# Patient Record
Sex: Female | Born: 1995 | Hispanic: Yes | Marital: Single | Smoking: Never smoker
Health system: Southern US, Community
[De-identification: ages and names within clinical notes are randomized; demographics above are authoritative.]

## PROBLEM LIST (undated history)

## (undated) DIAGNOSIS — F329 Major depressive disorder, single episode, unspecified: Secondary | ICD-10-CM

## (undated) DIAGNOSIS — F419 Anxiety disorder, unspecified: Secondary | ICD-10-CM

## (undated) DIAGNOSIS — F32A Depression, unspecified: Secondary | ICD-10-CM

## (undated) DIAGNOSIS — R519 Headache, unspecified: Secondary | ICD-10-CM

## (undated) DIAGNOSIS — Z789 Other specified health status: Secondary | ICD-10-CM

## (undated) HISTORY — DX: Anxiety disorder, unspecified: F41.9

## (undated) HISTORY — PX: NO PAST SURGERIES: SHX2092

## (undated) HISTORY — DX: Headache, unspecified: R51.9

## (undated) HISTORY — DX: Depression, unspecified: F32.A

---
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
## 2018-06-14 ENCOUNTER — Emergency Department (HOSPITAL_COMMUNITY): Payer: BLUE CROSS/BLUE SHIELD

## 2018-06-14 ENCOUNTER — Encounter (HOSPITAL_COMMUNITY): Payer: Self-pay | Admitting: *Deleted

## 2018-06-14 ENCOUNTER — Other Ambulatory Visit: Payer: Self-pay

## 2018-06-14 ENCOUNTER — Emergency Department (HOSPITAL_COMMUNITY)
Admission: EM | Admit: 2018-06-14 | Discharge: 2018-06-14 | Disposition: A | Discharge status: Home / Self Care | Payer: BLUE CROSS/BLUE SHIELD | Attending: Emergency Medicine | Admitting: Emergency Medicine

## 2018-06-14 DIAGNOSIS — R1110 Vomiting, unspecified: Secondary | ICD-10-CM | POA: Insufficient documentation

## 2018-06-14 DIAGNOSIS — J189 Pneumonia, unspecified organism: Secondary | ICD-10-CM | POA: Insufficient documentation

## 2018-06-14 LAB — COMPREHENSIVE METABOLIC PANEL
ALT: 18 U/L (ref 0–44)
ANION GAP: 12 (ref 5–15)
AST: 26 U/L (ref 15–41)
Albumin: 4 g/dL (ref 3.5–5.0)
Alkaline Phosphatase: 56 U/L (ref 38–126)
BUN: 9 mg/dL (ref 6–20)
CO2: 24 mmol/L (ref 22–32)
Calcium: 9.4 mg/dL (ref 8.9–10.3)
Chloride: 100 mmol/L (ref 98–111)
Creatinine, Ser: 0.92 mg/dL (ref 0.44–1.00)
Glucose, Bld: 93 mg/dL (ref 70–99)
Potassium: 4.3 mmol/L (ref 3.5–5.1)
Sodium: 136 mmol/L (ref 135–145)
Total Bilirubin: 0.5 mg/dL (ref 0.3–1.2)
Total Protein: 8.3 g/dL — ABNORMAL HIGH (ref 6.5–8.1)

## 2018-06-14 LAB — I-STAT CG4 LACTIC ACID, ED: Lactic Acid, Venous: 0.85 mmol/L (ref 0.5–1.9)

## 2018-06-14 LAB — CBC WITH DIFFERENTIAL/PLATELET
Abs Immature Granulocytes: 0.05 10*3/uL (ref 0.00–0.07)
Basophils Absolute: 0 10*3/uL (ref 0.0–0.1)
Basophils Relative: 0 %
EOS ABS: 0.1 10*3/uL (ref 0.0–0.5)
Eosinophils Relative: 1 %
HCT: 40.6 % (ref 36.0–46.0)
Hemoglobin: 13.3 g/dL (ref 12.0–15.0)
IMMATURE GRANULOCYTES: 0 %
Lymphocytes Relative: 8 %
Lymphs Abs: 1.1 10*3/uL (ref 0.7–4.0)
MCH: 29.5 pg (ref 26.0–34.0)
MCHC: 32.8 g/dL (ref 30.0–36.0)
MCV: 90 fL (ref 80.0–100.0)
Monocytes Absolute: 0.9 10*3/uL (ref 0.1–1.0)
Monocytes Relative: 7 %
Neutro Abs: 10.8 10*3/uL — ABNORMAL HIGH (ref 1.7–7.7)
Neutrophils Relative %: 84 %
Platelets: 405 10*3/uL — ABNORMAL HIGH (ref 150–400)
RBC: 4.51 MIL/uL (ref 3.87–5.11)
RDW: 11.4 % — ABNORMAL LOW (ref 11.5–15.5)
WBC: 12.9 10*3/uL — AB (ref 4.0–10.5)
nRBC: 0 % (ref 0.0–0.2)

## 2018-06-14 LAB — I-STAT BETA HCG BLOOD, ED (MC, WL, AP ONLY)

## 2018-06-14 MED ORDER — AMOXICILLIN-POT CLAVULANATE ER 1000-62.5 MG PO TB12: 2.0000 | ORAL_TABLET | Freq: Two times a day (BID) | ORAL | 0 refills | Status: DC

## 2018-06-14 MED ORDER — DOXYCYCLINE HYCLATE 100 MG PO CAPS: 100.0000 mg | ORAL_CAPSULE | Freq: Two times a day (BID) | ORAL | 0 refills | Status: DC

## 2018-06-14 MED ORDER — ONDANSETRON HCL 4 MG PO TABS: 4.0000 mg | ORAL_TABLET | Freq: Four times a day (QID) | ORAL | 0 refills | Status: DC

## 2018-06-14 MED ORDER — ONDANSETRON 4 MG PO TBDP
4.0000 mg | ORAL_TABLET | Freq: Once | ORAL | Status: AC
  Administered 2018-06-14: 4 mg via ORAL
  Filled 2018-06-14: qty 1

## 2018-06-14 NOTE — ED Provider Notes (Signed)
[facility] EMERGENCY DEPARTMENT Provider Note   CSN: [id number] Arrival date & time: 06/14/18  1223     History   Chief Complaint Chief Complaint  Patient presents with  . Pneumonia    HPI Diana Woodard is a 22 y.o. female.  HPI  22 year old female with no significant past medical history presents today with complaints of pneumonia.  She notes originally on February 27 she developed upper respiratory infection including cough.  She notes she was seen [facility] 2 December.  At that time she was diagnosed with acute bronchitis and was started on cough medication, steroids, and albuterol inhaler.  She notes she continued to have symptoms with coughing fatigue and minor shortness of breath.  She was again seen 4 days ago and was diagnosed with pneumonia.  She was put on cefdinir at that time.  She notes she has been taking the antibiotics for the last 4 days with no improvement in her symptoms.  She denies any significant worsening in symptoms, she denies any fever.  She notes no close sick contacts.  She does note a history asthma as a kid but denies any complications or need for inhalers.  Patient notes that she has had vomiting after coughing, none at rest.  She denies any previous hospitalizations or risk factors for hospital-acquired pneumonia.     History reviewed. No pertinent past medical history.  There are no active problems to display for this patient.   History reviewed. No pertinent surgical history.   OB History   None      Home Medications    Prior to Admission medications   Medication Sig Start Date End Date Taking? Authorizing Provider  amoxicillin-clavulanate (AUGMENTIN XR) 1000-62.5 MG 12 hr tablet Take 2 tablets by mouth 2 (two) times daily. 06/14/18   Alyviah Crandle, Tinnie Gens, PA-C  doxycycline (VIBRAMYCIN) 100 MG capsule Take 1 capsule (100 mg total) by mouth 2 (two) times daily. 06/14/18   Soma Bachand, Tinnie Gens, PA-C  ondansetron  (ZOFRAN) 4 MG tablet Take 1 tablet (4 mg total) by mouth every 6 (six) hours. 06/14/18   Eyvonne Mechanic, PA-C    Family History History reviewed. No pertinent family history.  Social History Social History   Tobacco Use  . Smoking status: Not on file  Substance Use Topics  . Alcohol use: Not on file  . Drug use: Not on file     Allergies   Patient has no known allergies.   Review of Systems Review of Systems  All other systems reviewed and are negative.   Physical Exam Updated Vital Signs BP 128/81 (BP Location: Right Arm)   Pulse (!) 110   Temp 98.4 F (36.9 C) (Oral)   Resp 16   SpO2 94%   Physical Exam  Constitutional: She is oriented to person, place, and time. She appears well-developed and well-nourished.  HENT:  Head: Normocephalic and atraumatic.  Eyes: Pupils are equal, round, and reactive to light. Conjunctivae are normal. Right eye exhibits no discharge. Left eye exhibits no discharge. No scleral icterus.  Neck: Normal range of motion. No JVD present. No tracheal deviation present.  Cardiovascular: Normal rate, regular rhythm and normal heart sounds.  Pulmonary/Chest: Effort normal and breath sounds normal. No stridor. No respiratory distress. She has no wheezes. She has no rales. She exhibits no tenderness.  Neurological: She is alert and oriented to person, place, and time. Coordination normal.  Psychiatric: She has a normal mood and affect. Her behavior  is normal. Judgment and thought content normal.  Nursing note and vitals reviewed.    ED Treatments / Results  Labs (all labs ordered are listed, but only abnormal results are displayed) Labs Reviewed  COMPREHENSIVE METABOLIC PANEL - Abnormal; Notable for the following components:      Result Value   Total Protein 8.3 (*)    All other components within normal limits  CBC WITH DIFFERENTIAL/PLATELET - Abnormal; Notable for the following components:   WBC 12.9 (*)    RDW 11.4 (*)    Platelets 405  (*)    Neutro Abs 10.8 (*)    All other components within normal limits  URINALYSIS, ROUTINE W REFLEX MICROSCOPIC  I-STAT CG4 LACTIC ACID, ED  I-STAT BETA HCG BLOOD, ED ([facility], [facility], [facility] ONLY)  I-STAT CG4 LACTIC ACID, ED    EKG None  Radiology Dg Chest 2 View  Result Date: 06/14/2018 CLINICAL DATA:  Productive cough EXAM: CHEST - 2 VIEW COMPARISON:  None. FINDINGS: There is focal infiltrate in the posterior segment of the left lower lobe. There is more ill-defined patchy infiltrate throughout portions of the right lower lobe. Lungs elsewhere are clear. Heart size and pulmonary vascularity are normal. No adenopathy. No bone lesions. IMPRESSION: Focal infiltrate consistent with pneumonia in the posterior segment of the left lower lobe. More patchy areas of infiltrate throughout the right lower lobe. Lungs elsewhere are clear. No adenopathy. Electronically Signed   By: Bretta Bang III M.D.   On: 06/14/2018 13:27    Procedures Procedures (including critical care time)  Medications Ordered in ED Medications  ondansetron (ZOFRAN-ODT) disintegrating tablet 4 mg (4 mg Oral Given 06/14/18 1405)     Initial Impression / Assessment and Plan / ED Course  I have reviewed the triage vital signs and the nursing notes.  Pertinent labs & imaging results that were available during my care of the patient were reviewed by me and considered in my medical decision making (see chart for details).     Labs:   Imaging:  Consults:  Therapeutics:  Discharge Meds: Augmentin, doxycycline, Zofran  Assessment/Plan: 22 year old female presents today with community-acquired pneumonia.  Patient is well-appearing in no acute distress and is afebrile.  She has clear lung sounds I personally ambulated her with no hypoxic episodes ranging in the mid 90s.  Patient has been on cefdinir for the last 4 days, she has x-ray showing pneumonia here.  She is otherwise healthy young female with reassuring physical exam.   I do feel she would be stable for outpatient management with dual therapy as indicated.  She will follow-up immediately if she develops any new or worsening signs or symptoms, she will follow-up for repeat evaluation in 5 days if her symptoms are not significantly improving and still having symptoms.  Patient notes that she is having episodes of vomiting after coughing, none at rest, likely posttussive emesis.  Patient verbalized understanding and agreement to today's plan had no further questions or concerns at time of discharge.     Final Clinical Impressions(s) / ED Diagnoses   Final diagnoses:  Community acquired pneumonia, unspecified laterality    ED Discharge Orders         Ordered    amoxicillin-clavulanate (AUGMENTIN XR) 1000-62.5 MG 12 hr tablet  2 times daily     06/14/18 1414    doxycycline (VIBRAMYCIN) 100 MG capsule  2 times daily     06/14/18 1414    ondansetron (ZOFRAN) 4 MG tablet  Every 6  hours     06/14/18 1429           Eyvonne MechanicHedges, Kaaliyah Kita, PA-C 06/14/18 1430    Gerhard MunchLockwood, Robert, MD 06/15/18 2103

## 2018-06-14 NOTE — Discharge Instructions (Addendum)
Please read attached information. If you experience any new or worsening signs or symptoms please return to the emergency room for evaluation. Please follow-up with your primary care provider or specialist as discussed. Please use medication prescribed only as directed and discontinue taking if you have any concerning signs or symptoms.   °

## 2018-06-14 NOTE — ED Notes (Signed)
ED Provider at bedside. 

## 2018-06-14 NOTE — ED Triage Notes (Signed)
Pt went to urgent care on Friday and was dx with pneumonia, pt has also been vomiting for the last two weeks and wants to be evaluated for that

## 2018-06-14 NOTE — ED Notes (Signed)
Patient verbalizes understanding of discharge instructions. Opportunity for questioning and answers were provided. Armband removed by staff, pt discharged from ED.  

## 2019-03-22 ENCOUNTER — Other Ambulatory Visit: Payer: Self-pay | Admitting: Registered"

## 2019-03-22 DIAGNOSIS — Z20822 Contact with and (suspected) exposure to covid-19: Secondary | ICD-10-CM

## 2019-03-24 LAB — NOVEL CORONAVIRUS, NAA: SARS-CoV-2, NAA: NOT DETECTED

## 2019-04-28 IMAGING — DX DG CHEST 2V
2 series · 2 of 2 positions shown · non-contrast
Comparison: None.

CLINICAL DATA: Productive cough

EXAM:
CHEST - 2 VIEW

[chest pa]
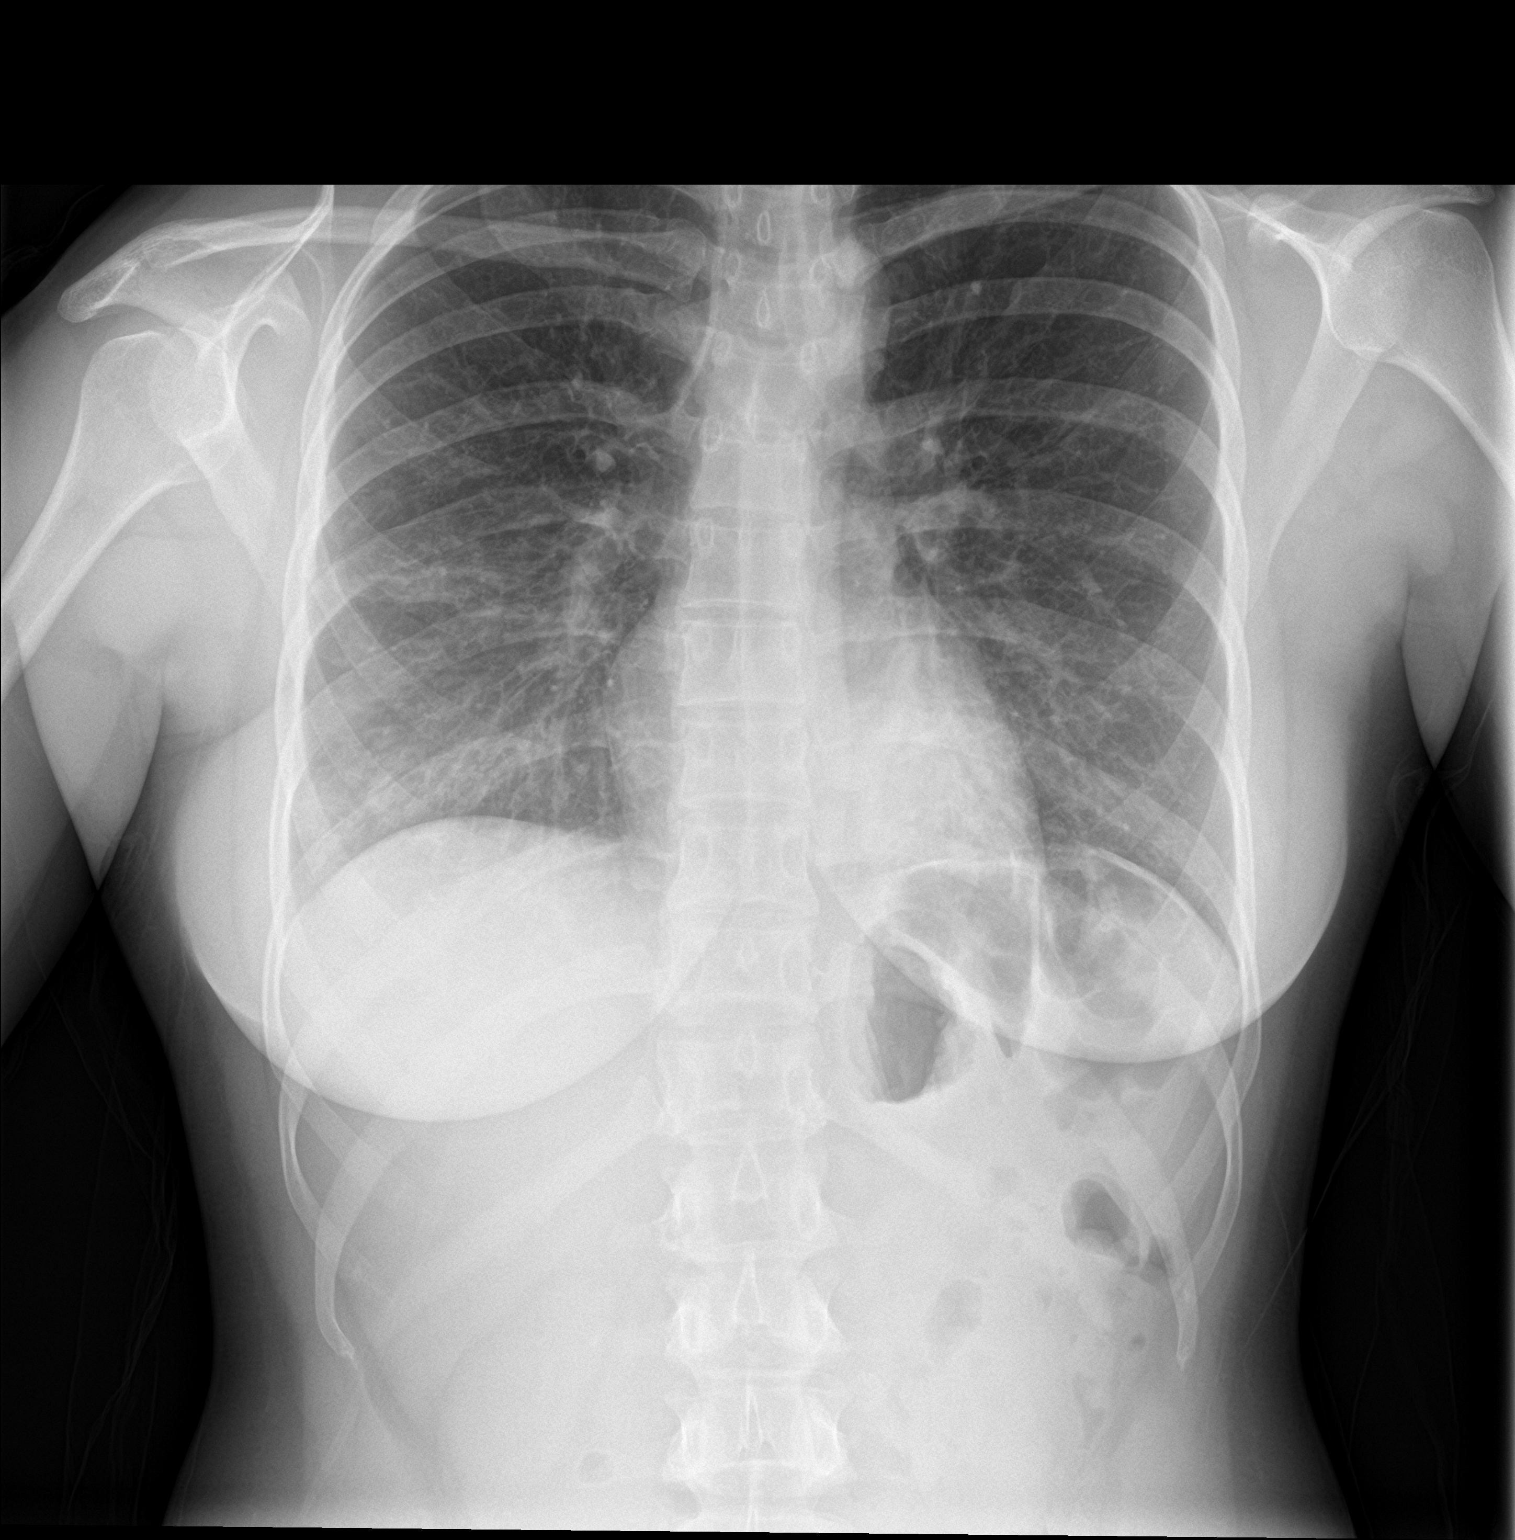

[chest lat]
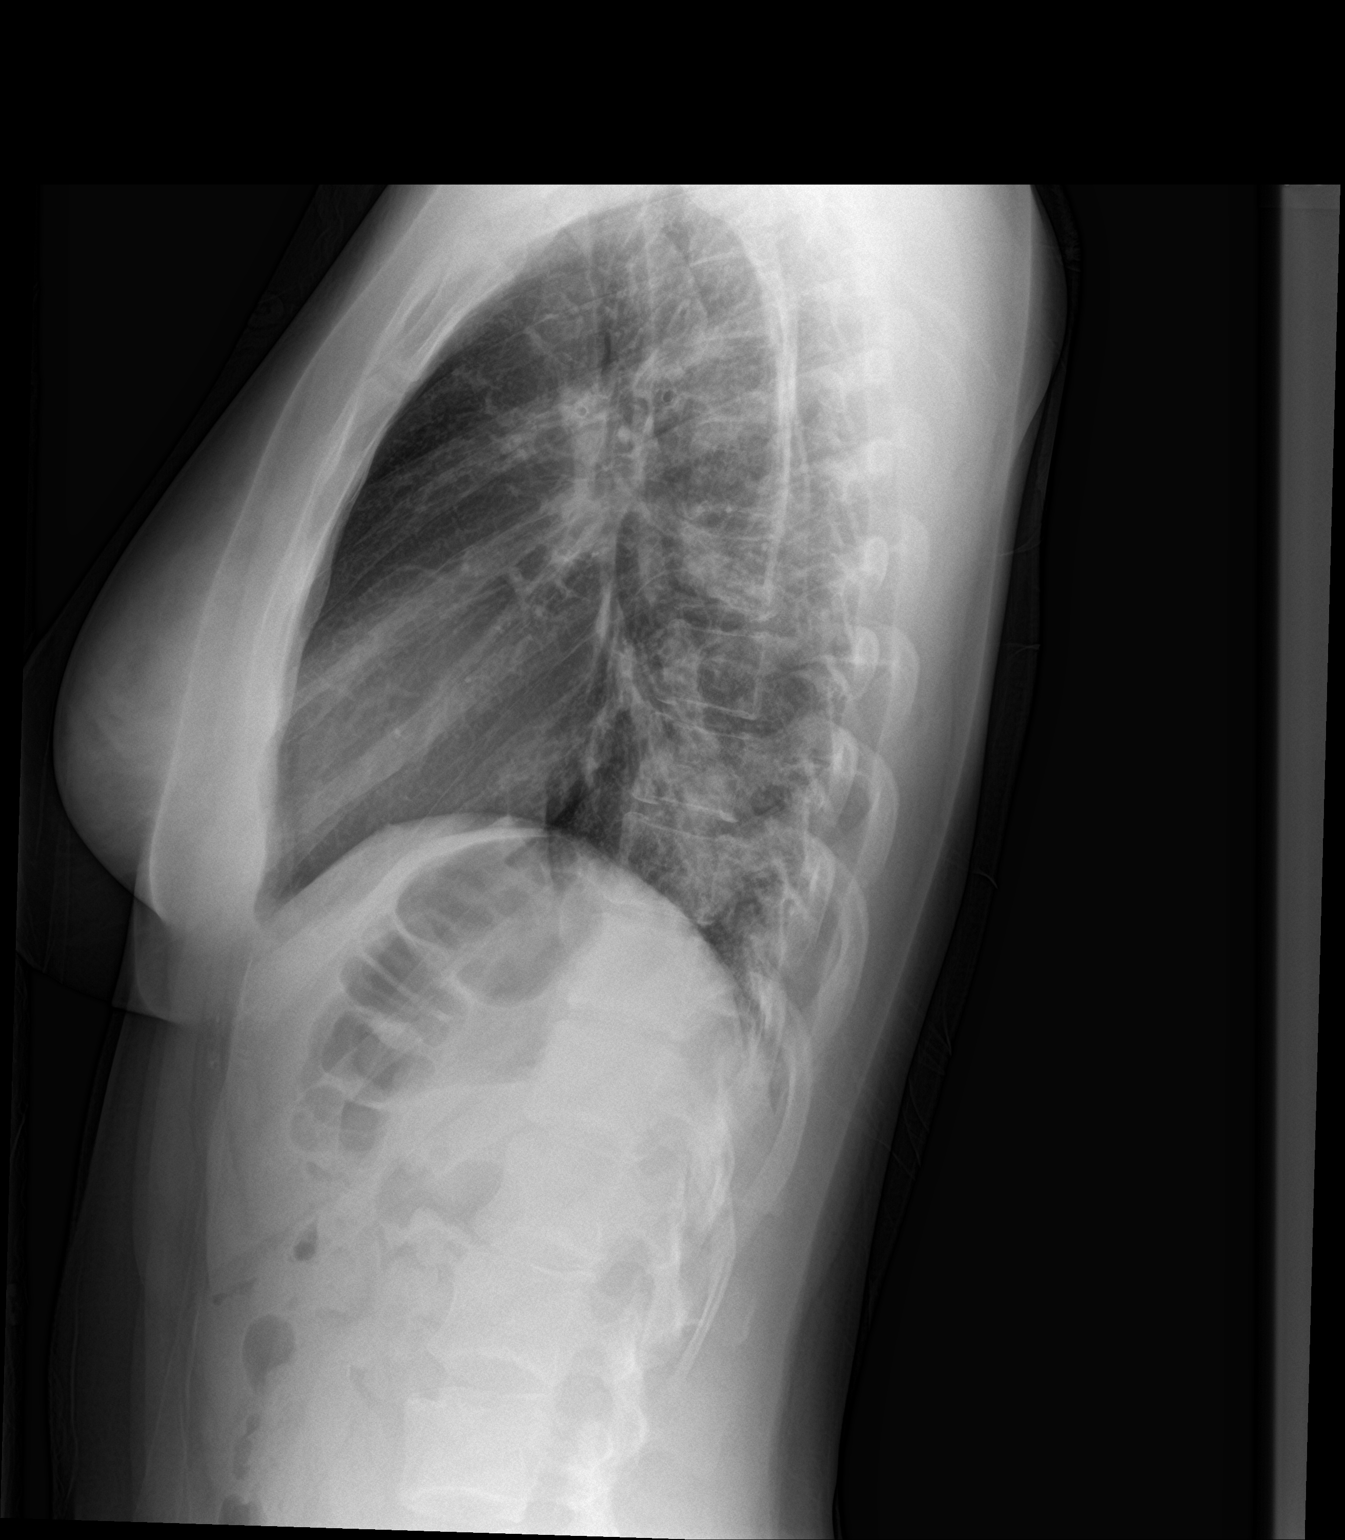

[2 of 2 positions shown; findings below may reference images not displayed]

FINDINGS: There is focal infiltrate in the posterior segment of the left lower
lobe. There is more ill-defined patchy infiltrate throughout
portions of the right lower lobe. Lungs elsewhere are clear. Heart
size and pulmonary vascularity are normal. No adenopathy. No bone
lesions.
IMPRESSION: Focal infiltrate consistent with pneumonia in the posterior segment
of the left lower lobe. More patchy areas of infiltrate throughout
the right lower lobe. Lungs elsewhere are clear. No adenopathy.

## 2019-06-10 ENCOUNTER — Other Ambulatory Visit: Payer: Self-pay

## 2019-06-10 DIAGNOSIS — Z20822 Contact with and (suspected) exposure to covid-19: Secondary | ICD-10-CM

## 2019-06-12 LAB — NOVEL CORONAVIRUS, NAA: SARS-CoV-2, NAA: NOT DETECTED

## 2019-07-08 NOTE — L&D Delivery Note (Addendum)
OB/GYN Faculty Practice Delivery Note  Diana Woodard is a 25 y.o. G1P0000 s/p augmented vaginal delivery at [redacted]w[redacted]d. She was admitted for SOL.   ROM: 8h 13m with mec fluid GBS Status:  Negative/-- (10/13 0130) Maximum Maternal Temperature: 99.7 F  Labor Progress: . Patient presented to L&D for SOL. Initial SVE: 1/70/-3. Labor course was complicated by protracted active labor so she was augmented with pitocin and IUPC was place. She then progressed to complete.   Delivery Date/Time: 1955 Delivery: Called to room and patient was complete and pushing. Head position was ROA and delivered with ease over the perineum. nuchal cord present and delivered. Shoulder and body delivered in usual fashion. Infant with spontaneous cry, placed on mother's abdomen, dried and stimulated. Cord clamped x 2 after 1-minute delay, and cut by FOB. Cord blood drawn. Placenta delivered spontaneously with gentle cord traction. Fundus firm with massage and pitocin started. Labia, perineum, vagina, and cervix inspected and significant for superficial bilateral labial laceration.  Baby Weight: 2980 g  Cord: central insertion, 3 vessel Placenta: Sent to L&D Complications: Compound Lacerations: Bilateral labial lacerations, and was repaired in the standard fashion 3.0 Vicryl EBL: 100cc Analgesia: Epidural   Infant: APGAR (1 MIN): 8   APGAR (5 MINS): 9    Kathrin Greathouse, MD, PGY-1 OBGYN Faculty Teaching Service  05/17/2020, 8:41 PM  I was present and gloved for the entire deliveryt. I agree with the findings and the plan of care as documented in the resident's note.  Casper Harrison, MD [facility] Fellow, [facility] for [facility], [facility]

## 2019-11-03 ENCOUNTER — Encounter (HOSPITAL_COMMUNITY): Payer: Self-pay | Admitting: Emergency Medicine

## 2019-11-03 ENCOUNTER — Other Ambulatory Visit: Payer: Self-pay

## 2019-11-03 ENCOUNTER — Emergency Department (HOSPITAL_COMMUNITY): Payer: BC Managed Care – PPO

## 2019-11-03 ENCOUNTER — Emergency Department (HOSPITAL_COMMUNITY)
Admission: EM | Admit: 2019-11-03 | Discharge: 2019-11-03 | Disposition: A | Discharge status: Home / Self Care | Payer: BC Managed Care – PPO | Attending: Emergency Medicine | Admitting: Emergency Medicine

## 2019-11-03 DIAGNOSIS — R1030 Lower abdominal pain, unspecified: Secondary | ICD-10-CM | POA: Diagnosis not present

## 2019-11-03 DIAGNOSIS — R109 Unspecified abdominal pain: Secondary | ICD-10-CM

## 2019-11-03 DIAGNOSIS — O99891 Other specified diseases and conditions complicating pregnancy: Secondary | ICD-10-CM | POA: Insufficient documentation

## 2019-11-03 DIAGNOSIS — Z79899 Other long term (current) drug therapy: Secondary | ICD-10-CM | POA: Diagnosis not present

## 2019-11-03 DIAGNOSIS — Z3A12 12 weeks gestation of pregnancy: Secondary | ICD-10-CM | POA: Insufficient documentation

## 2019-11-03 DIAGNOSIS — O26899 Other specified pregnancy related conditions, unspecified trimester: Secondary | ICD-10-CM

## 2019-11-03 LAB — COMPREHENSIVE METABOLIC PANEL
ALT: 14 U/L (ref 0–44)
AST: 23 U/L (ref 15–41)
Albumin: 4.3 g/dL (ref 3.5–5.0)
Alkaline Phosphatase: 39 U/L (ref 38–126)
Anion gap: 8 (ref 5–15)
BUN: 14 mg/dL (ref 6–20)
CO2: 21 mmol/L — ABNORMAL LOW (ref 22–32)
Calcium: 9.3 mg/dL (ref 8.9–10.3)
Chloride: 106 mmol/L (ref 98–111)
Creatinine, Ser: 0.68 mg/dL (ref 0.44–1.00)
GFR calc Af Amer: >60 mL/min (ref >60)
GFR calc non Af Amer: >60 mL/min (ref >60)
Glucose, Bld: 86 mg/dL (ref 70–99)
Potassium: 3.9 mmol/L (ref 3.5–5.1)
Sodium: 135 mmol/L (ref 135–145)
Total Bilirubin: 0.2 mg/dL — ABNORMAL LOW (ref 0.3–1.2)
Total Protein: 7.7 g/dL (ref 6.5–8.1)

## 2019-11-03 LAB — URINALYSIS, ROUTINE W REFLEX MICROSCOPIC
Bilirubin Urine: NEGATIVE
Glucose, UA: NEGATIVE mg/dL
Hgb urine dipstick: NEGATIVE
Ketones, ur: 5 mg/dL — AB
Nitrite: NEGATIVE
Protein, ur: NEGATIVE mg/dL
Specific Gravity, Urine: 1.019 (ref 1.005–1.030)
pH: 6 (ref 5.0–8.0)

## 2019-11-03 LAB — CBC
HCT: 37.6 % (ref 36.0–46.0)
Hemoglobin: 12.7 g/dL (ref 12.0–15.0)
MCH: 29.7 pg (ref 26.0–34.0)
MCHC: 33.8 g/dL (ref 30.0–36.0)
MCV: 87.9 fL (ref 80.0–100.0)
Platelets: 200 10*3/uL (ref 150–400)
RBC: 4.28 MIL/uL (ref 3.87–5.11)
RDW: 12.4 % (ref 11.5–15.5)
WBC: 10.1 10*3/uL (ref 4.0–10.5)
nRBC: 0 % (ref 0.0–0.2)

## 2019-11-03 LAB — WET PREP, GENITAL
Clue Cells Wet Prep HPF POC: NONE SEEN
Sperm: NONE SEEN
Trich, Wet Prep: NONE SEEN
Yeast Wet Prep HPF POC: NONE SEEN

## 2019-11-03 LAB — HCG, QUANTITATIVE, PREGNANCY: hCG, Beta Chain, Quant, S: 72348 m[IU]/mL — ABNORMAL HIGH (ref <5)

## 2019-11-03 LAB — LIPASE, BLOOD: Lipase: 33 U/L (ref 11–51)

## 2019-11-03 NOTE — ED Provider Notes (Signed)
[facility] DEPT Provider Note   CSN: [id number] Arrival date & time: 11/03/19  1701     History Chief Complaint  Patient presents with  . Abdominal Pain    Diana Woodard is a 24 y.o. female.  24yo healthy F G1P0 at 12 weeks who p/w abdominal pain.  Patient reports 4 days of intermittent, lower abdominal pain that comes and goes randomly, is sharp, and seemed worse today.  She denies any associated vaginal bleeding/discharge, nausea/vomiting, fevers, diarrhea, or constipation.  She was seen by an OB/GYN approximately 3 weeks ago and called the clinic to try to be seen today but was not able to get an appointment.  She reports having dysuria one time but no persistent dysuria.  She has been eating and drinking normally.  The history is provided by the patient.  Abdominal Pain      Past Medical History:  Diagnosis Date  . Medical history non-contributory     There are no problems to display for this patient.   Past Surgical History:  Procedure Laterality Date  . NO PAST SURGERIES       OB History   No obstetric history on file.     History reviewed. No pertinent family history.  Social History   Tobacco Use  . Smoking status: Never Smoker  . Smokeless tobacco: Never Used  Substance Use Topics  . Alcohol use: Not Currently  . Drug use: Never    Home Medications Prior to Admission medications   Medication Sig Start Date End Date Taking? Authorizing Provider  fexofenadine (ALLEGRA) 180 MG tablet Take 180 mg by mouth daily.   Yes [provider]  Prenatal Vit-Fe Fumarate-FA (MULTIVITAMIN-PRENATAL) 27-0.8 MG TABS tablet Take 1 tablet by mouth daily at 12 noon.   Yes [provider]  amoxicillin-clavulanate (AUGMENTIN XR) 1000-62.5 MG 12 hr tablet Take 2 tablets by mouth 2 (two) times daily. Patient not taking: Reported on 11/03/2019 06/14/18   Hedges, Dellis Filbert, PA-C  doxycycline (VIBRAMYCIN) 100 MG capsule Take 1  capsule (100 mg total) by mouth 2 (two) times daily. Patient not taking: Reported on 11/03/2019 06/14/18   Hedges, Dellis Filbert, PA-C  ondansetron (ZOFRAN) 4 MG tablet Take 1 tablet (4 mg total) by mouth every 6 (six) hours. Patient not taking: Reported on 11/03/2019 06/14/18   Okey Regal, PA-C    Allergies    Patient has no known allergies.  Review of Systems   Review of Systems  Gastrointestinal: Positive for abdominal pain.   All other systems reviewed and are negative except that which was mentioned in HPI Physical Exam Updated Vital Signs BP 115/74   Pulse 64   Temp 98.4 F (36.9 C) (Oral)   Resp 16   LMP 08/09/2019   SpO2 98%   Physical Exam Vitals and nursing note reviewed. Exam conducted with a chaperone present.  Constitutional:      General: She is not in acute distress.    Appearance: She is well-developed.  HENT:     Head: Normocephalic and atraumatic.  Eyes:     Conjunctiva/sclera: Conjunctivae normal.  Cardiovascular:     Rate and Rhythm: Normal rate and regular rhythm.     Heart sounds: Normal heart sounds. No murmur.  Pulmonary:     Effort: Pulmonary effort is normal.     Breath sounds: Normal breath sounds.  Abdominal:     General: Bowel sounds are normal. There is no distension.     Palpations: Abdomen is soft.  Tenderness: There is no abdominal tenderness.  Genitourinary:    Cervix: No cervical motion tenderness.        Comments: Trace white d/c in vaginal vault; red lesion on cervix, not bleeding; no CMT or adnexal tenderness Musculoskeletal:     Cervical back: Neck supple.  Skin:    General: Skin is warm and dry.  Neurological:     Mental Status: She is alert and oriented to person, place, and time.     Comments: Fluent speech  Psychiatric:        Judgment: Judgment normal.     ED Results / Procedures / Treatments   Labs (all labs ordered are listed, but only abnormal results are displayed) Labs Reviewed  WET PREP, GENITAL -  Abnormal; Notable for the following components:      Result Value   WBC, Wet Prep HPF POC FEW (*)    All other components within normal limits  COMPREHENSIVE METABOLIC PANEL - Abnormal; Notable for the following components:   CO2 21 (*)    Total Bilirubin 0.2 (*)    All other components within normal limits  URINALYSIS, ROUTINE W REFLEX MICROSCOPIC - Abnormal; Notable for the following components:   APPearance HAZY (*)    Ketones, ur 5 (*)    Leukocytes,Ua TRACE (*)    Bacteria, UA RARE (*)    All other components within normal limits  HCG, QUANTITATIVE, PREGNANCY - Abnormal; Notable for the following components:   hCG, Beta Chain, Quant, S 72,348 (*)    All other components within normal limits  URINE CULTURE  LIPASE, BLOOD  CBC  GC/CHLAMYDIA PROBE AMP ([place]) NOT AT [facility]    EKG None  Radiology US OB Comp < 14 Wks  Result Date: 11/03/2019 CLINICAL DATA:  Pelvic pain. By LMP patient is 12 weeks 2 days. EDC by LMP is 08/09/2019. EXAM: OBSTETRIC <14 WK ULTRASOUND TECHNIQUE: Transabdominal ultrasound was performed for evaluation of the gestation as well as the maternal uterus and adnexal regions. COMPARISON:  None. FINDINGS: Intrauterine gestational sac: Single Yolk sac:  Not Visualized. Embryo:  Visualized. Cardiac Activity: Visualized. Heart Rate: 179 bpm CRL: 65.1 mm   12 w 6 d                  [place] EDC: 05/11/2020 Subchorionic hemorrhage:  None visualized. Maternal uterus/adnexae: Ovaries are not visualized. No free pelvic fluid. IMPRESSION: 1. Single living intrauterine embryo measuring 12.68. 2. Ultrasound dating differs compared clinical dating. 3. By today's exam EDC is 05/11/2020. Electronically Signed   By: Norva Pavlov M.D.   On: 11/03/2019 20:09    Procedures Procedures (including critical care time)  Medications Ordered in ED Medications - No data to display  ED Course  I have reviewed the triage vital signs and the nursing notes.  Pertinent labs & imaging  results that were available during my care of the patient were reviewed by me and considered in my medical decision making (see chart for details).    MDM Rules/Calculators/A&P                      Comfortable on exam, no focal abdominal tenderness.  Reassuring pelvic exam with no evidence of infection.  CMP and CBC normal.  Beta hCG 72,000.  UA with trace amount of blood but no other findings to suggest infection.  Pelvic ultrasound shows single IUP at 12 weeks 6 days with fetal heart rate 179.  No abnormal findings.  Patient remains comfortable on reassessment.  I have recommended that she follow-up with OB/GYN and I have extensively reviewed return precautions including any vaginal bleeding/passage of fluid or sudden worsening of symptoms.  She voiced understanding.  Final Clinical Impression(s) / ED Diagnoses Final diagnoses:  Abdominal pain during intrauterine pregnancy    Rx / DC Orders ED Discharge Orders    None       Chinchilla, Ambrose Finland, MD 11/03/19 [phone]

## 2019-11-03 NOTE — ED Triage Notes (Signed)
Patient reports she is [redacted] weeks pregnant, c/o abdominal cramping x4 days. Denies N/V/D.

## 2019-11-04 LAB — URINE CULTURE: Culture: NO GROWTH

## 2019-11-04 LAB — GC/CHLAMYDIA PROBE AMP (~~LOC~~) NOT AT ARMC
Chlamydia: POSITIVE — AB
Comment: NEGATIVE
Comment: NORMAL
Neisseria Gonorrhea: NEGATIVE

## 2019-11-05 ENCOUNTER — Other Ambulatory Visit: Payer: Self-pay

## 2019-11-05 MED ORDER — AZITHROMYCIN 250 MG PO TABS: 1000.0000 mg | ORAL_TABLET | Freq: Once | ORAL | 0 refills | Status: AC

## 2019-11-05 NOTE — Progress Notes (Signed)
RX sent to pharmacy for positive chlamydia

## 2019-12-14 ENCOUNTER — Other Ambulatory Visit (HOSPITAL_COMMUNITY)
Admission: RE | Admit: 2019-12-14 | Discharge: 2019-12-14 | Disposition: A | Discharge status: Home / Self Care | Payer: BC Managed Care – PPO | Source: Ambulatory Visit | Attending: Obstetrics & Gynecology | Admitting: Obstetrics & Gynecology

## 2019-12-14 ENCOUNTER — Ambulatory Visit (INDEPENDENT_AMBULATORY_CARE_PROVIDER_SITE_OTHER): Payer: BC Managed Care – PPO | Admitting: Obstetrics & Gynecology

## 2019-12-14 ENCOUNTER — Other Ambulatory Visit: Payer: Self-pay

## 2019-12-14 ENCOUNTER — Encounter: Payer: Self-pay | Admitting: Obstetrics & Gynecology

## 2019-12-14 DIAGNOSIS — Z3A18 18 weeks gestation of pregnancy: Secondary | ICD-10-CM | POA: Diagnosis not present

## 2019-12-14 DIAGNOSIS — Z3492 Encounter for supervision of normal pregnancy, unspecified, second trimester: Secondary | ICD-10-CM

## 2019-12-14 DIAGNOSIS — Z3482 Encounter for supervision of other normal pregnancy, second trimester: Secondary | ICD-10-CM

## 2019-12-14 DIAGNOSIS — Z3402 Encounter for supervision of normal first pregnancy, second trimester: Secondary | ICD-10-CM | POA: Diagnosis not present

## 2019-12-14 DIAGNOSIS — R87610 Atypical squamous cells of undetermined significance on cytologic smear of cervix (ASC-US): Secondary | ICD-10-CM | POA: Insufficient documentation

## 2019-12-14 DIAGNOSIS — Z1151 Encounter for screening for human papillomavirus (HPV): Secondary | ICD-10-CM | POA: Diagnosis not present

## 2019-12-14 DIAGNOSIS — Z3490 Encounter for supervision of normal pregnancy, unspecified, unspecified trimester: Secondary | ICD-10-CM | POA: Insufficient documentation

## 2019-12-14 MED ORDER — BLOOD PRESSURE MONITOR KIT: 1.0000 | PACK | Freq: Once | 0 refills | Status: AC

## 2019-12-14 NOTE — Patient Instructions (Signed)

## 2019-12-14 NOTE — Progress Notes (Signed)
  Subjective:    Diana Woodard is a G1P0 [redacted]w[redacted]d being seen today for her first obstetrical visit.  Her obstetrical history is significant for uncomplicated pregnancy. Patient does intend to breast feed. Pregnancy history fully reviewed.  Patient reports No complaints other than round ligament pain.  Vitals:   12/14/19 1429 12/14/19 1433  BP: 94/60   Pulse: (!) 106   Temp: 98.2 F (36.8 C)   Weight: 56.2 kg   Height:  5\' 3"  (1.6 m)    HISTORY: OB History  Gravida Para Term Preterm AB Living  1 0          SAB TAB Ectopic Multiple Live Births               # Outcome Date GA Lbr Len/2nd Weight Sex Delivery Anes PTL Lv  1 Current            Past Medical History:  Diagnosis Date  . Anxiety   . Depression   . Headache    Past Surgical History:  Procedure Laterality Date  . NO PAST SURGERIES     Family History  Problem Relation Age of Onset  . Asthma Mother   . Depression Mother   . Miscarriages / Stillbirths Maternal Aunt   . Hypertension Maternal Grandmother   . Diabetes Maternal Grandfather      Exam    Uterus:     Pelvic Exam:    Perineum: No Hemorrhoids   Vulva: normal   Vagina:  normal mucosa   Cervix: no bleeding following Pap and no cervical motion tenderness   Adnexa: normal adnexa and no mass, fullness, tenderness   Bony Pelvis: gynecoid  System: Breast:  normal appearance, no masses or tenderness   Skin: normal coloration and turgor, no rashes    Neurologic: oriented, normal, negative   Extremities: normal strength, tone, and muscle mass   HEENT PERRLA   Neck no masses   Cardiovascular: regular rate and rhythm   Respiratory:  appears well, vitals normal, no respiratory distress, acyanotic, normal RR, ear and throat exam is normal, neck free of mass or lymphadenopathy, chest clear, no wheezing, crepitations, rhonchi, normal symmetric air entry   Abdomen: soft, non-tender; bowel sounds normal; no masses,  no organomegaly   Urinary: urethral meatus  normal      Assessment:    Pregnancy: G1P0 Patient Active Problem List   Diagnosis Date Noted  . Supervision of normal pregnancy, antepartum 12/14/2019        Plan:     Initial labs drawn. Prenatal vitamins. Problem list reviewed and updated. Genetic Screening discussed Quad Screen: requested.  Ultrasound discussed; fetal survey: requested.  Follow up in 4 weeks. 80% of 20 min visit spent on counseling and coordination of care.  Bleeding precautions given.    02/13/2020 12/14/2019

## 2019-12-15 LAB — CBC/D/PLT+RPR+RH+ABO+RUB AB...
Antibody Screen: NEGATIVE
Basophils Absolute: 0 10*3/uL (ref 0.0–0.2)
Basos: 0 %
EOS (ABSOLUTE): 0.1 10*3/uL (ref 0.0–0.4)
Eos: 1 %
HCV Ab: <0.1 s/co ratio (ref 0.0–0.9)
HIV Screen 4th Generation wRfx: NONREACTIVE
Hematocrit: 37.6 % (ref 34.0–46.6)
Hemoglobin: 12.8 g/dL (ref 11.1–15.9)
Hepatitis B Surface Ag: NEGATIVE
Immature Grans (Abs): 0 10*3/uL (ref 0.0–0.1)
Immature Granulocytes: 0 %
Lymphocytes Absolute: 1.5 10*3/uL (ref 0.7–3.1)
Lymphs: 15 %
MCH: 30.5 pg (ref 26.6–33.0)
MCHC: 34 g/dL (ref 31.5–35.7)
MCV: 90 fL (ref 79–97)
Monocytes Absolute: 0.7 10*3/uL (ref 0.1–0.9)
Monocytes: 7 %
Neutrophils Absolute: 7.9 10*3/uL — ABNORMAL HIGH (ref 1.4–7.0)
Neutrophils: 77 %
Platelets: 219 10*3/uL (ref 150–450)
RBC: 4.2 x10E6/uL (ref 3.77–5.28)
RDW: 12.7 % (ref 11.7–15.4)
RPR Ser Ql: NONREACTIVE
Rh Factor: POSITIVE
Rubella Antibodies, IGG: 2.35 index (ref >0.99)
WBC: 10.3 10*3/uL (ref 3.4–10.8)

## 2019-12-15 LAB — HCV INTERPRETATION

## 2019-12-16 LAB — CERVICOVAGINAL ANCILLARY ONLY
Bacterial Vaginitis (gardnerella): NEGATIVE
Candida Glabrata: NEGATIVE
Candida Vaginitis: NEGATIVE
Chlamydia: NEGATIVE
Comment: NEGATIVE
Comment: NEGATIVE
Comment: NEGATIVE
Comment: NEGATIVE
Comment: NEGATIVE
Comment: NORMAL
Neisseria Gonorrhea: NEGATIVE
Trichomonas: NEGATIVE

## 2019-12-16 LAB — URINE CULTURE, OB REFLEX

## 2019-12-16 LAB — CULTURE, OB URINE

## 2019-12-19 ENCOUNTER — Encounter: Payer: Self-pay | Admitting: Obstetrics and Gynecology

## 2019-12-21 ENCOUNTER — Encounter: Payer: Self-pay | Admitting: Obstetrics and Gynecology

## 2019-12-21 LAB — CYTOLOGY - PAP
Comment: NEGATIVE
Comment: NEGATIVE
Comment: NEGATIVE
Diagnosis: UNDETERMINED — AB
HPV 16: NEGATIVE
HPV 18 / 45: NEGATIVE
High risk HPV: POSITIVE — AB

## 2019-12-22 ENCOUNTER — Telehealth: Payer: Self-pay | Admitting: Lactation Services

## 2019-12-22 ENCOUNTER — Other Ambulatory Visit: Payer: Self-pay | Admitting: Obstetrics & Gynecology

## 2019-12-22 ENCOUNTER — Other Ambulatory Visit: Payer: Self-pay

## 2019-12-22 ENCOUNTER — Ambulatory Visit: Payer: BC Managed Care – PPO | Attending: Obstetrics and Gynecology

## 2019-12-22 DIAGNOSIS — Z363 Encounter for antenatal screening for malformations: Secondary | ICD-10-CM

## 2019-12-22 DIAGNOSIS — Z3490 Encounter for supervision of normal pregnancy, unspecified, unspecified trimester: Secondary | ICD-10-CM | POA: Diagnosis present

## 2019-12-22 DIAGNOSIS — Z3A19 19 weeks gestation of pregnancy: Secondary | ICD-10-CM

## 2019-12-22 DIAGNOSIS — O358XX0 Maternal care for other (suspected) fetal abnormality and damage, not applicable or unspecified: Secondary | ICD-10-CM

## 2019-12-22 NOTE — Telephone Encounter (Signed)
-----   Message from Malachy Chamber, MD sent at 12/22/2019 12:52 AM EDT ----- ASCUS HRHPV, needs colpo scheduled.

## 2019-12-22 NOTE — Telephone Encounter (Signed)
Called patient to inform her of Pap results. Asked patient to call the office. Patient is a [facility] patient, will forward to their office.

## 2020-01-11 ENCOUNTER — Encounter: Payer: Self-pay | Admitting: Obstetrics and Gynecology

## 2020-01-11 ENCOUNTER — Ambulatory Visit (INDEPENDENT_AMBULATORY_CARE_PROVIDER_SITE_OTHER): Payer: BC Managed Care – PPO | Admitting: Obstetrics and Gynecology

## 2020-01-11 ENCOUNTER — Other Ambulatory Visit: Payer: Self-pay

## 2020-01-11 VITALS — BP 101/66 | HR 79 | Wt 128.3 lb

## 2020-01-11 DIAGNOSIS — Z3490 Encounter for supervision of normal pregnancy, unspecified, unspecified trimester: Secondary | ICD-10-CM

## 2020-01-11 DIAGNOSIS — Z3A22 22 weeks gestation of pregnancy: Secondary | ICD-10-CM

## 2020-01-11 DIAGNOSIS — Z3402 Encounter for supervision of normal first pregnancy, second trimester: Secondary | ICD-10-CM

## 2020-01-11 NOTE — Progress Notes (Signed)
Pt is here for ROB, [redacted]w[redacted]d.

## 2020-01-11 NOTE — Progress Notes (Signed)
   PRENATAL VISIT NOTE  Subjective:  Diana Woodard is a 24 y.o. G1P0 at [redacted]w[redacted]d being seen today for ongoing prenatal care.  She is currently monitored for the following issues for this low-risk pregnancy and has Supervision of normal pregnancy, antepartum on their problem list.  Patient reports no complaints.  Contractions: Not present. Vag. Bleeding: None.  Movement: Present. Denies leaking of fluid.   The following portions of the patient's history were reviewed and updated as appropriate: allergies, current medications, past family history, past medical history, past social history, past surgical history and problem list.   Objective:   Vitals:   01/11/20 1303  BP: 101/66  Pulse: 79  Weight: 128 lb 4.8 oz (58.2 kg)   Fetal Status: Fetal Heart Rate (bpm): 160   Movement: Present     General:  Alert, oriented and cooperative. Patient is in no acute distress.  Skin: Skin is warm and dry. No rash noted.   Cardiovascular: Normal heart rate noted  Respiratory: Normal respiratory effort, no problems with respiration noted  Abdomen: Soft, gravid, appropriate for gestational age.  Pain/Pressure: Present     Pelvic: Cervical exam deferred        Extremities: Normal range of motion.  Edema: None  Mental Status: Normal mood and affect. Normal behavior. Normal judgment and thought content.   Assessment and Plan:  Pregnancy: G1P0 at [redacted]w[redacted]d  1. Encounter for supervision of normal pregnancy, antepartum, unspecified gravidity - Patient has not had COVID vaccine. Reviewed risks/benefits in pregnancy/breastfeeding and gave info. She declines. - had long discussion regarding pap and HPV, answered all questions  Preterm labor symptoms and general obstetric precautions including but not limited to vaginal bleeding, contractions, leaking of fluid and fetal movement were reviewed in detail with the patient. Please refer to After Visit Summary for other counseling recommendations.   Return in about  4 weeks (around 02/08/2020) for low OB, in person, 2 hr GTT, 3rd trim labs.  No future appointments.  Conan Bowens, MD

## 2020-01-11 NOTE — Patient Instructions (Signed)

## 2020-01-13 LAB — AFP, SERUM, OPEN SPINA BIFIDA
AFP MoM: 0.81
AFP Value: 68.8 ng/mL
Gest. Age on Collection Date: 22.1 weeks
Maternal Age At EDD: 25.5 yr
OSBR Risk 1 IN: 10000
Test Results:: NEGATIVE
Weight: 128 [lb_av]

## 2020-02-08 ENCOUNTER — Other Ambulatory Visit: Payer: Self-pay

## 2020-02-08 ENCOUNTER — Other Ambulatory Visit: Payer: BC Managed Care – PPO

## 2020-02-08 ENCOUNTER — Ambulatory Visit (INDEPENDENT_AMBULATORY_CARE_PROVIDER_SITE_OTHER): Payer: BC Managed Care – PPO | Admitting: Obstetrics and Gynecology

## 2020-02-08 VITALS — BP 119/70 | HR 67 | Wt 133.4 lb

## 2020-02-08 DIAGNOSIS — Z3490 Encounter for supervision of normal pregnancy, unspecified, unspecified trimester: Secondary | ICD-10-CM

## 2020-02-08 DIAGNOSIS — Z3A26 26 weeks gestation of pregnancy: Secondary | ICD-10-CM | POA: Insufficient documentation

## 2020-02-08 NOTE — Progress Notes (Signed)
Pt is here for ROB and 2 hour GTT, [redacted]w[redacted]d.

## 2020-02-08 NOTE — Patient Instructions (Signed)

## 2020-02-08 NOTE — Progress Notes (Signed)
   PRENATAL VISIT NOTE  Subjective:  Diana Woodard is a 24 y.o. G1P0 at [redacted]w[redacted]d being seen today for ongoing prenatal care.  She is currently monitored for the following issues for this low-risk pregnancy and has Supervision of normal pregnancy, antepartum and [redacted] weeks gestation of pregnancy on their problem list.  Patient doing well with no acute concerns today. She reports no complaints.  Contractions: Not present. Vag. Bleeding: None.  Movement: Present. Denies leaking of fluid.   The following portions of the patient's history were reviewed and updated as appropriate: allergies, current medications, past family history, past medical history, past social history, past surgical history and problem list. Problem list updated.  Objective:   Vitals:   02/08/20 1016  BP: 119/70  Pulse: 67  Weight: 133 lb 6.4 oz (60.5 kg)    Fetal Status: Fetal Heart Rate (bpm): 155 Fundal Height: 27 cm Movement: Present     General:  Alert, oriented and cooperative. Patient is in no acute distress.  Skin: Skin is warm and dry. No rash noted.   Cardiovascular: Normal heart rate noted  Respiratory: Normal respiratory effort, no problems with respiration noted  Abdomen: Soft, gravid, appropriate for gestational age.  Pain/Pressure: Present     Pelvic: Cervical exam deferred        Extremities: Normal range of motion.  Edema: None  Mental Status:  Normal mood and affect. Normal behavior. Normal judgment and thought content.   Assessment and Plan:  Pregnancy: G1P0 at [redacted]w[redacted]d  1. Encounter for supervision of normal pregnancy, antepartum, primagravid No complaints, again discussed HPV in regards to delivery.  Pt reassured this is generally not an issue - Glucose Tolerance, 2 Hours w/1 Hour - CBC - RPR - HIV Antibody (routine testing w rflx)  2. [redacted] weeks gestation of pregnancy   Preterm labor symptoms and general obstetric precautions including but not limited to vaginal bleeding, contractions, leaking  of fluid and fetal movement were reviewed in detail with the patient.  Please refer to After Visit Summary for other counseling recommendations.   Return in about 2 weeks (around 02/22/2020) for ROB, in person.   Mariel Aloe, MD

## 2020-02-09 LAB — CBC
Hematocrit: 35.8 % (ref 34.0–46.6)
Hemoglobin: 12.3 g/dL (ref 11.1–15.9)
MCH: 31.3 pg (ref 26.6–33.0)
MCHC: 34.4 g/dL (ref 31.5–35.7)
MCV: 91 fL (ref 79–97)
Platelets: 203 10*3/uL (ref 150–450)
RBC: 3.93 x10E6/uL (ref 3.77–5.28)
RDW: 12.5 % (ref 11.7–15.4)
WBC: 8.6 10*3/uL (ref 3.4–10.8)

## 2020-02-09 LAB — RPR: RPR Ser Ql: NONREACTIVE

## 2020-02-09 LAB — GLUCOSE TOLERANCE, 2 HOURS W/ 1HR
Glucose, 1 hour: 91 mg/dL (ref 65–179)
Glucose, 2 hour: 81 mg/dL (ref 65–152)
Glucose, Fasting: 69 mg/dL (ref 65–91)

## 2020-02-09 LAB — HIV ANTIBODY (ROUTINE TESTING W REFLEX): HIV Screen 4th Generation wRfx: NONREACTIVE

## 2020-02-22 ENCOUNTER — Encounter: Payer: Self-pay | Admitting: Nurse Practitioner

## 2020-02-22 ENCOUNTER — Ambulatory Visit (INDEPENDENT_AMBULATORY_CARE_PROVIDER_SITE_OTHER): Payer: BC Managed Care – PPO | Admitting: Nurse Practitioner

## 2020-02-22 VITALS — BP 117/73 | HR 79 | Wt 135.2 lb

## 2020-02-22 DIAGNOSIS — Z3A28 28 weeks gestation of pregnancy: Secondary | ICD-10-CM

## 2020-02-22 DIAGNOSIS — Z3490 Encounter for supervision of normal pregnancy, unspecified, unspecified trimester: Secondary | ICD-10-CM

## 2020-02-22 NOTE — Progress Notes (Signed)
    Subjective:  Diana Woodard is a 24 y.o. G1P0 at [redacted]w[redacted]d being seen today for ongoing prenatal care.  She is currently monitored for the following issues for this low-risk pregnancy and has Supervision of normal pregnancy, antepartum on their problem list.  Patient reports no complaints.  Contractions: Not present. Vag. Bleeding: None.  Movement: Present. Denies leaking of fluid.   The following portions of the patient's history were reviewed and updated as appropriate: allergies, current medications, past family history, past medical history, past social history, past surgical history and problem list. Problem list updated.  Objective:   Vitals:   02/22/20 0922  BP: 117/73  Pulse: 79  Weight: 135 lb 3.2 oz (61.3 kg)    Fetal Status: Fetal Heart Rate (bpm): 162 Fundal Height: 29 cm Movement: Present     General:  Alert, oriented and cooperative. Patient is in no acute distress.  Skin: Skin is warm and dry. No rash noted.   Cardiovascular: Normal heart rate noted  Respiratory: Normal respiratory effort, no problems with respiration noted  Abdomen: Soft, gravid, appropriate for gestational age. Pain/Pressure: Absent     Pelvic:  Cervical exam deferred        Extremities: Normal range of motion.  Edema: None  Mental Status: Normal mood and affect. Normal behavior. Normal judgment and thought content.   Urinalysis:      Assessment and Plan:  Pregnancy: G1P0 at [redacted]w[redacted]d  1. Supervision of normal pregnancy Doing well and baby is moving well Glucola done today Discussed getting BP cuff today Advised TDAP is recommended as soon as it arrives in the office. Discussed signing up for childbirth and breastfeeding classes.  Preterm labor symptoms and general obstetric precautions including but not limited to vaginal bleeding, contractions, leaking of fluid and fetal movement were reviewed in detail with the patient. Please refer to After Visit Summary for other counseling  recommendations.  Return in about 2 weeks (around 03/07/2020) for in person ROB  - TDAP needed.  Nolene Bernheim, RN, MSN, NP-BC Nurse Practitioner, [facility] for [employer], [facility] 02/22/2020 9:46 AM

## 2020-02-22 NOTE — Progress Notes (Signed)
Patient presents for ROB. Patient has no concerns today. 

## 2020-03-07 ENCOUNTER — Ambulatory Visit (INDEPENDENT_AMBULATORY_CARE_PROVIDER_SITE_OTHER): Payer: BC Managed Care – PPO | Admitting: Women's Health

## 2020-03-07 ENCOUNTER — Other Ambulatory Visit: Payer: Self-pay

## 2020-03-07 ENCOUNTER — Other Ambulatory Visit (HOSPITAL_COMMUNITY)
Admission: RE | Admit: 2020-03-07 | Discharge: 2020-03-07 | Disposition: A | Discharge status: Home / Self Care | Payer: BC Managed Care – PPO | Source: Ambulatory Visit | Attending: Women's Health | Admitting: Women's Health

## 2020-03-07 VITALS — BP 112/66 | HR 72 | Wt 137.2 lb

## 2020-03-07 DIAGNOSIS — O98811 Other maternal infectious and parasitic diseases complicating pregnancy, first trimester: Secondary | ICD-10-CM | POA: Insufficient documentation

## 2020-03-07 DIAGNOSIS — A749 Chlamydial infection, unspecified: Secondary | ICD-10-CM

## 2020-03-07 DIAGNOSIS — R87810 Cervical high risk human papillomavirus (HPV) DNA test positive: Secondary | ICD-10-CM

## 2020-03-07 DIAGNOSIS — F32A Depression, unspecified: Secondary | ICD-10-CM | POA: Insufficient documentation

## 2020-03-07 DIAGNOSIS — Z3A30 30 weeks gestation of pregnancy: Secondary | ICD-10-CM | POA: Insufficient documentation

## 2020-03-07 DIAGNOSIS — R87610 Atypical squamous cells of undetermined significance on cytologic smear of cervix (ASC-US): Secondary | ICD-10-CM | POA: Insufficient documentation

## 2020-03-07 DIAGNOSIS — O99340 Other mental disorders complicating pregnancy, unspecified trimester: Secondary | ICD-10-CM

## 2020-03-07 DIAGNOSIS — O3443 Maternal care for other abnormalities of cervix, third trimester: Secondary | ICD-10-CM | POA: Diagnosis not present

## 2020-03-07 DIAGNOSIS — Z3400 Encounter for supervision of normal first pregnancy, unspecified trimester: Secondary | ICD-10-CM

## 2020-03-07 DIAGNOSIS — F329 Major depressive disorder, single episode, unspecified: Secondary | ICD-10-CM

## 2020-03-07 DIAGNOSIS — O98819 Other maternal infectious and parasitic diseases complicating pregnancy, unspecified trimester: Secondary | ICD-10-CM | POA: Insufficient documentation

## 2020-03-07 DIAGNOSIS — O283 Abnormal ultrasonic finding on antenatal screening of mother: Secondary | ICD-10-CM | POA: Insufficient documentation

## 2020-03-07 NOTE — Progress Notes (Signed)
Subjective:  Diana Woodard is a 24 y.o. G1P0 at [redacted]w[redacted]d being seen today for ongoing prenatal care.  She is currently monitored for the following issues for this low-risk pregnancy and has Supervision of normal pregnancy, antepartum; ASCUS with positive high risk HPV cervical; Echogenic intracardiac focus of fetus on prenatal ultrasound; Chlamydia infection affecting pregnancy; and Depression affecting pregnancy on their problem list.   Patient reports no complaints.  Contractions: Not present. Vag. Bleeding: None.  Movement: Present. Denies leaking of fluid.   The following portions of the patient's history were reviewed and updated as appropriate: allergies, current medications, past family history, past medical history, past social history, past surgical history and problem list. Problem list updated.  Objective:   Vitals:   03/07/20 1109  BP: 112/66  Pulse: 72  Weight: 137 lb 3.2 oz (62.2 kg)    Fetal Status: Fetal Heart Rate (bpm): 156 Fundal Height: 30 cm Movement: Present     General:  Alert, oriented and cooperative. Patient is in no acute distress.  Skin: Skin is warm and dry. No rash noted.   Cardiovascular: Normal heart rate noted  Respiratory: Normal respiratory effort, no problems with respiration noted  Abdomen: Soft, gravid, appropriate for gestational age. Pain/Pressure: Absent     Pelvic: Vag. Bleeding: None     Cervical exam deferred        Extremities: Normal range of motion.  Edema: Trace  Mental Status: Normal mood and affect. Normal behavior. Normal judgment and thought content.   Urinalysis:      Assessment and Plan:  Pregnancy: G1P0 at [redacted]w[redacted]d  1. Supervision of normal first pregnancy, antepartum -discussed contraception, pt elects Paragard IUD -peds list given -CBE information given  2. ASCUS with positive high risk HPV cervical -Pap ASCUS +HPV Neg HPV 16, 18/45 -needs colpo ppartum per consultation with Dr. Earlene Plater  3. Echogenic intracardiac focus of  fetus on prenatal ultrasound  4. Chlamydia infection affecting pregnancy in first trimester - TOC today - Cervicovaginal ancillary only( [place])  5. Depression affecting pregnancy -Patient currently seeing private counselor.   Preterm labor symptoms and general obstetric precautions including but not limited to vaginal bleeding, contractions, leaking of fluid and fetal movement were reviewed in detail with the patient. I discussed the assessment and treatment plan with the patient. The patient was provided an opportunity to ask questions and all were answered. The patient agreed with the plan and demonstrated an understanding of the instructions. The patient was advised to call back or seek an in-person office evaluation/go to [facility] at [facility] for any urgent or concerning symptoms. Please refer to After Visit Summary for other counseling recommendations.  Return in about 2 weeks (around 03/21/2020) for in-person LOB/APP OK.   Adeyemi Hamad, Odie Sera, NP

## 2020-03-07 NOTE — Patient Instructions (Addendum)
Maternity Assessment Unit (MAU)  The Maternity Assessment Unit (MAU) is located at the [facility] [place] [facility]. The address is: [address] [place], [place], [place] [address]. Please see map below for additional directions.    The Maternity Assessment Unit is designed to help you during your pregnancy, and for up to 6 weeks after delivery, with any pregnancy- or postpartum-related emergencies, if you think you are in labor, or if your water has broken. For example, if you experience nausea and vomiting, vaginal bleeding, severe abdominal or pelvic pain, elevated blood pressure or other problems related to your pregnancy or postpartum time, please come to the Maternity Assessment Unit for assistance.        Preterm Labor and Birth Information  The normal length of a pregnancy is 39-41 weeks. Preterm labor is when labor starts before 37 completed weeks of pregnancy. What are the risk factors for preterm labor? Preterm labor is more likely to occur in women who:  Have certain infections during pregnancy such as a bladder infection, sexually transmitted infection, or infection inside the uterus (chorioamnionitis).  Have a shorter-than-normal cervix.  Have gone into preterm labor before.  Have had surgery on their cervix.  Are younger than age 64 or older than age 26.  Are African American.  Are pregnant with twins or multiple babies (multiple gestation).  Take street drugs or smoke while pregnant.  Do not gain enough weight while pregnant.  Became pregnant shortly after having been pregnant. What are the symptoms of preterm labor? Symptoms of preterm labor include:  Cramps similar to those that can happen during a menstrual period. The cramps may happen with diarrhea.  Pain in the abdomen or lower back.  Regular uterine contractions that may feel like tightening of the abdomen.  A feeling of increased pressure in the  pelvis.  Increased watery or bloody mucus discharge from the vagina.  Water breaking (ruptured amniotic sac). Why is it important to recognize signs of preterm labor? It is important to recognize signs of preterm labor because babies who are born prematurely may not be fully developed. This can put them at an increased risk for:  Long-term (chronic) heart and lung problems.  Difficulty immediately after birth with regulating body systems, including blood sugar, body temperature, heart rate, and breathing rate.  Bleeding in the brain.  Cerebral palsy.  Learning difficulties.  Death. These risks are highest for babies who are born before 38 weeks of pregnancy. How is preterm labor treated? Treatment depends on the length of your pregnancy, your condition, and the health of your baby. It may involve:  Having a stitch (suture) placed in your cervix to prevent your cervix from opening too early (cerclage).  Taking or being given medicines, such as: ? Hormone medicines. These may be given early in pregnancy to help support the pregnancy. ? Medicine to stop contractions. ? Medicines to help mature the baby's lungs. These may be prescribed if the risk of delivery is high. ? Medicines to prevent your baby from developing cerebral palsy. If the labor happens before 34 weeks of pregnancy, you may need to stay in the hospital. What should I do if I think I am in preterm labor? If you think that you are going into preterm labor, call your health care provider right away. How can I prevent preterm labor in future pregnancies? To increase your chance of having a full-term pregnancy:  Do not use any tobacco products, such as  cigarettes, chewing tobacco, and e-cigarettes. If you need help quitting, ask your health care provider.  Do not use street drugs or medicines that have not been prescribed to you during your pregnancy.  Talk with your health care provider before taking any herbal  supplements, even if you have been taking them regularly.  Make sure you gain a healthy amount of weight during your pregnancy.  Watch for infection. If you think that you might have an infection, get it checked right away.  Make sure to tell your health care provider if you have gone into preterm labor before. This information is not intended to replace advice given to you by your health care provider. Make sure you discuss any questions you have with your health care provider. Document Revised: 10/15/2018 Document Reviewed: 11/14/2015 Elsevier Patient Education  2020 [employer].        Contraception Choices Contraception, also called birth control, refers to methods or devices that prevent pregnancy. Hormonal methods Contraceptive implant  A contraceptive implant is a thin, plastic tube that contains a hormone. It is inserted into the upper part of the arm. It can remain in place for up to 3 years. Progestin-only injections Progestin-only injections are injections of progestin, a synthetic form of the hormone progesterone. They are given every 3 months by a health care provider. Birth control pills  Birth control pills are pills that contain hormones that prevent pregnancy. They must be taken once a day, preferably at the same time each day. Birth control patch  The birth control patch contains hormones that prevent pregnancy. It is placed on the skin and must be changed once a week for three weeks and removed on the fourth week. A prescription is needed to use this method of contraception. Vaginal ring  A vaginal ring contains hormones that prevent pregnancy. It is placed in the vagina for three weeks and removed on the fourth week. After that, the process is repeated with a new ring. A prescription is needed to use this method of contraception. Emergency contraceptive Emergency contraceptives prevent pregnancy after unprotected sex. They come in pill form and can be taken up  to 5 days after sex. They work best the sooner they are taken after having sex. Most emergency contraceptives are available without a prescription. This method should not be used as your only form of birth control. Barrier methods Female condom  A female condom is a thin sheath that is worn over the penis during sex. Condoms keep sperm from going inside a woman's body. They can be used with a spermicide to increase their effectiveness. They should be disposed after a single use. Female condom  A female condom is a soft, loose-fitting sheath that is put into the vagina before sex. The condom keeps sperm from going inside a woman's body. They should be disposed after a single use. Diaphragm  A diaphragm is a soft, dome-shaped barrier. It is inserted into the vagina before sex, along with a spermicide. The diaphragm blocks sperm from entering the uterus, and the spermicide kills sperm. A diaphragm should be left in the vagina for 6-8 hours after sex and removed within 24 hours. A diaphragm is prescribed and fitted by a health care provider. A diaphragm should be replaced every 1-2 years, after giving birth, after gaining more than 15 lb (6.8 kg), and after pelvic surgery. Cervical cap  A cervical cap is a round, soft latex or plastic cup that fits over the cervix. It is inserted into  the vagina before sex, along with spermicide. It blocks sperm from entering the uterus. The cap should be left in place for 6-8 hours after sex and removed within 48 hours. A cervical cap must be prescribed and fitted by a health care provider. It should be replaced every 2 years. Sponge  A sponge is a soft, circular piece of polyurethane foam with spermicide on it. The sponge helps block sperm from entering the uterus, and the spermicide kills sperm. To use it, you make it wet and then insert it into the vagina. It should be inserted before sex, left in for at least 6 hours after sex, and removed and thrown away within 30  hours. Spermicides Spermicides are chemicals that kill or block sperm from entering the cervix and uterus. They can come as a cream, jelly, suppository, foam, or tablet. A spermicide should be inserted into the vagina with an applicator at least 10-15 minutes before sex to allow time for it to work. The process must be repeated every time you have sex. Spermicides do not require a prescription. Intrauterine contraception Intrauterine device (IUD) An IUD is a T-shaped device that is put in a woman's uterus. There are two types:  Hormone IUD.This type contains progestin, a synthetic form of the hormone progesterone. This type can stay in place for 3-5 years.  Copper IUD.This type is wrapped in copper wire. It can stay in place for 10 years.  Permanent methods of contraception Female tubal ligation In this method, a woman's fallopian tubes are sealed, tied, or blocked during surgery to prevent eggs from traveling to the uterus. Hysteroscopic sterilization In this method, a small, flexible insert is placed into each fallopian tube. The inserts cause scar tissue to form in the fallopian tubes and block them, so sperm cannot reach an egg. The procedure takes about 3 months to be effective. Another form of birth control must be used during those 3 months. Female sterilization This is a procedure to tie off the tubes that carry sperm (vasectomy). After the procedure, the man can still ejaculate fluid (semen). Natural planning methods Natural family planning In this method, a couple does not have sex on days when the woman could become pregnant. Calendar method This means keeping track of the length of each menstrual cycle, identifying the days when pregnancy can happen, and not having sex on those days. Ovulation method In this method, a couple avoids sex during ovulation. Symptothermal method This method involves not having sex during ovulation. The woman typically checks for ovulation by watching  changes in her temperature and in the consistency of cervical mucus. Post-ovulation method In this method, a couple waits to have sex until after ovulation. Summary  Contraception, also called birth control, means methods or devices that prevent pregnancy.  Hormonal methods of contraception include implants, injections, pills, patches, vaginal rings, and emergency contraceptives.  Barrier methods of contraception can include female condoms, female condoms, diaphragms, cervical caps, sponges, and spermicides.  There are two types of IUDs (intrauterine devices). An IUD can be put in a woman's uterus to prevent pregnancy for 3-5 years.  Permanent sterilization can be done through a procedure for males, females, or both.  Natural family planning methods involve not having sex on days when the woman could become pregnant. This information is not intended to replace advice given to you by your health care provider. Make sure you discuss any questions you have with your health care provider. Document Revised: 06/25/2017 Document Reviewed: 07/26/2016 Elsevier Patient  Education  2020 [employer].        AREA PEDIATRIC/FAMILY PRACTICE PHYSICIANS  [facility] [address] [place], [place] [address] Phone - [phone]   Fax - [phone]  JACK AMOS [address] [place], [place]  [address] Phone - [phone]   Fax - [phone]  [facility] [address] [place], [place]  [address] Phone - [phone]   Fax - [phone]  [facility] [address] [place], [place]  [address] Phone - [phone]   Fax - [phone]  [facility] [address] [place], [place]  [address] Phone - [phone]   Fax - [phone]  [facility] [address] [place], [place]  [address] Phone - [phone]   Fax - [phone]  [facility] [place] [address], [address] [place], [place]  [address] Phone - [phone]   Fax - [phone]  [facility] AT [facility] [address] [place], [address] [place], [place]  [address] Phone - [phone]   Fax - [phone]  [facility] AT [facility] [address] [place], [place]  [address] Phone - [phone]   Fax - [phone] [facility] AT [place] [address] [place], [place]  [address] Phone - [phone]   Fax - [phone]  [facility] [address] [place], [place]  [address] Phone - [phone]   Fax - [phone]  [facility] [address] [place], [place]  [address] Phone - [phone]   Fax - [phone]  [facility] [place] [address] [place], [place]  [address] Phone - [phone]   Fax - [phone]  [facility] [address] [place], [place]  [address] Phone - [phone]  [facility] [address] [place], [place]  [address] Phone - [phone]   Fax - [phone]  [facility] [address] [place], [place]  [address] Phone - [phone]   Fax - [phone]  [facility] [address] [place], [place]  [address] Phone - [phone]   Fax - [phone]  [facility] [address] [place], [place]  [address] Phone - [phone]   Fax - [phone]   [facility] [address] [place], [place]  [address] Phone - [phone]   Fax - [phone]  [facility] [address] [place], [place]  [address] Phone - [phone]   Fax - [phone]  DAVID RUBIN [address] [place], [place]  [address] Phone - [phone]   Fax - [phone]  [facility] [address] [place], [place]  [address] Phone - [phone]   Fax -  [phone]  [facility] Alita Chyle [address] [place], [place]  [address] Phone - [phone]   Fax - [phone] Gerarda Fraction [address] [place], [place]  [address] Phone - [phone]   Fax - [phone]  [facility] [address] [place], [place]  [address] Phone - [phone]   Fax - [phone]  [facility] [place] [address] [place], [place]  [address] Phone - [phone]   Fax - [phone]         Childbirth Education Options: [facility] Department Classes:  Childbirth education classes can help  you get ready for a positive parenting experience. You can also meet other expectant parents and get free stuff for your baby. Each class runs for five weeks on the same night and costs $45 for the mother-to-be and her support person. Medicaid covers the cost if you are eligible. Call [phone] to register. [facility] Childbirth Education:  [phone] or [phone] or [email or web address]  Baby & Me Class: Discuss newborn & infant parenting and family adjustment issues with other new mothers in a relaxed environment. Each week brings a new speaker or baby-centered activity. We encourage new mothers to join [place] every Thursday at 11:00am. Babies birth until crawling. No registration or fee. Daddy [employer]: This course offers Dads-to-be the tools and knowledge needed to feel confident on their journey to becoming new fathers. Experienced dads, who have been trained as coaches, teach dads-to-be how to hold, comfort, diaper, swaddle and play with their infant while being able to support the new mom as well. A class for men taught by men. $25/dad Big Brother/Big Sister: Let your children share in the joy of a new brother or sister in this special class designed just for them. Class includes discussion about how families care for babies: swaddling, holding, diapering, safety as  well as how they can be helpful in their new role. This class is designed for children ages 2 to 74, but any age is welcome. Please register each child individually. $5/child  Mom Talk: This mom-led group offers support and connection to mothers as they journey through the adjustments and struggles of that sometimes overwhelming first year after the birth of a child. Tuesdays at 10:00am and Thursdays at 6:00pm. Babies welcome. No registration or fee. Breastfeeding Support Group: This group is a mother-to-mother support circle where moms have the opportunity to share their breastfeeding experiences. A Lactation Consultant is present for questions and concerns. Meets each Tuesday at 11:00am. No fee or registration. Breastfeeding Your Baby: Learn what to expect in the first days of breastfeeding your newborn.  This class will help you feel more confident with the skills needed to begin your breastfeeding experience. Many new mothers are concerned about breastfeeding after leaving the hospital. This class will also address the most common fears and challenges about breastfeeding during the first few weeks, months and beyond. (call for fee) Comfort Techniques and Tour: This 2 hour interactive class will provide you the opportunity to learn & practice hands-on techniques that can help relieve some of the discomfort of labor and encourage your baby to rotate toward the best position for birth. You and your partner will be able to try a variety of labor positions with birth balls and rebozos as well as practice breathing, relaxation, and visualization techniques. A tour of the [facility] is included with this class. $20 per registrant and support person Childbirth Class- Weekend Option: This class is a Weekend version of our Birth & Baby series. It is designed for parents who have a difficult time fitting several weeks of classes into their schedule. It covers the care of your newborn and the  basics of labor and childbirth. It also includes a Maternity Care Center Tour of [facility] and lunch. The class is held two consecutive days: beginning on Friday evening from 6:30 - 8:30 p.m. and the next day, Saturday from 9 a.m. - 4 p.m. (call for fee) Linden Dolin Class: Interested in a waterbirth?  This informational class will help you discover whether waterbirth is the right fit for  you. Education about waterbirth itself, supplies you would need and how to assemble your support team is what you can expect from this class. Some obstetrical practices require this class in order to pursue a waterbirth. (Not all obstetrical practices offer waterbirth-check with your healthcare provider.) Register only the expectant mom, but you are encouraged to bring your partner to class! Required if planning waterbirth, no fee. Infant/Child CPR: Parents, grandparents, babysitters, and friends learn Cardio-Pulmonary Resuscitation skills for infants and children. You will also learn how to treat both conscious and unconscious choking in infants and children. This Family & Friends program does not offer certification. Register each participant individually to ensure that enough mannequins are available. (Call for fee) Grandparent Love: Expecting a grandbaby? This class is for you! Learn about the latest infant care and safety recommendations and ways to support your own child as he or she transitions into the parenting role. Taught by Registered Nurses who are childbirth instructors, but most importantly...they are grandmothers too! $10/person. Childbirth Class- Natural Childbirth: This series of 5 weekly classes is for expectant parents who want to learn and practice natural methods of coping with the process of labor and childbirth. Relaxation, breathing, massage, visualization, role of the partner, and helpful positioning are highlighted. Participants learn how to be confident in their body's ability to give birth.  This class will empower and help parents make informed decisions about their own care. Includes discussion that will help new parents transition into the immediate postpartum period. Maternity Care Center Tour of [facility] is included. We suggest taking this class between 25-32 weeks, but it's only a recommendation. $75 per registrant and one support person or $30 Medicaid. Childbirth Class- 3 week Series: This option of 3 weekly classes helps you and your labor partner prepare for childbirth. Newborn care, labor & birth, cesarean birth, pain management, and comfort techniques are discussed and a Maternity Care Center Tour of [facility] is included. The class meets at the same time, on the same day of the week for 3 consecutive weeks beginning with the starting date you choose. $60 for registrant and one support person.  Marvelous Multiples: Expecting twins, triplets, or more? This class covers the differences in labor, birth, parenting, and breastfeeding issues that face multiples' parents. NICU tour is included. Led by a Certified Childbirth Educator who is the mother of twins. No fee. Caring for Baby: This class is for expectant and adoptive parents who want to learn and practice the most up-to-date newborn care for their babies. Focus is on birth through the first six weeks of life. Topics include feeding, bathing, diapering, crying, umbilical cord care, circumcision care and safe sleep. Parents learn to recognize symptoms of illness and when to call the pediatrician. Register only the mom-to-be and your partner or support person can plan to come with you! $10 per registrant and support person Childbirth Class- online option: This online class offers you the freedom to complete a Birth and Baby series in the comfort of your own home. The flexibility of this option allows you to review sections at your own pace, at times convenient to you and your support people. It includes additional video  information, animations, quizzes, and extended activities. Get organized with helpful eClass tools, checklists, and trackers. Once you register online for the class, you will receive an email within a few days to accept the invitation and begin the class when the time is right for you. The content will be available to you for 60  days. $60 for 60 days of online access for you and your support people.         Intrauterine Device Information An intrauterine device (IUD) is a medical device that is inserted in the uterus to prevent pregnancy. It is a small, T-shaped device that has one or two nylon strings hanging down from it. The strings hang out of the lower part of the uterus (cervix) to allow for future IUD removal. There are two types of IUDs available:  Hormone IUD. This type of IUD is made of plastic and contains the hormone progestin (synthetic progesterone). A hormone IUD may last 3-5 years.  Copper IUD. This type of IUD has copper wire wrapped around it. A copper IUD may last up to 10 years. How is an IUD inserted? An IUD is inserted through the vagina and placed into the uterus with a minor medical procedure. The exact procedure for IUD insertion may vary among health care providers and hospitals. How does an IUD work? Synthetic progesterone in a hormonal IUD prevents pregnancy by:  Thickening cervical mucus to prevent sperm from entering the uterus.  Thinning the uterine lining to prevent a fertilized egg from being implanted there. Copper in a copper IUD prevents pregnancy by making the uterus and fallopian tubes produce a fluid that kills sperm. What are the advantages of an IUD? Advantages of either type of IUD  It is highly effective in preventing pregnancy.  It is reversible. You can become pregnant shortly after the IUD is removed.  It is low-maintenance and can stay in place for a long time.  There are no estrogen-related side effects.  It can be used when  breastfeeding.  It is not associated with weight gain.  It can be inserted right after childbirth, an abortion, or a miscarriage. Advantages of a hormone IUD  If it is inserted within 7 days of your period starting, it works right after it is inserted. If the hormone IUD is inserted at any other time in your cycle, you will need to use a backup method of birth control for 7 days after insertion.  It can make menstrual periods lighter.  It can reduce menstrual cramping.  It can be used for 3-5 years. Advantages of a copper IUD  It works right after it is inserted.  It can be used as a form of emergency birth control if it is inserted within 5 days after having unprotected sex.  It does not interfere with your body's natural hormones.  It can be used for 10 years. What are the disadvantages of an IUD?  An IUD may cause irregular menstrual bleeding for a period of time after insertion.  You may have pain during insertion and have cramping and vaginal bleeding after insertion.  An IUD may cut the uterus (uterine perforation) when it is inserted. This is rare.  An IUD may cause pelvic inflammatory disease (PID), which is an infection in the uterus and fallopian tubes. This is rare, and it usually happens during the first 20 days after the IUD is inserted.  A copper IUD can make your menstrual flow heavier and more painful. How is an IUD removed?  You will lie on your back with your knees bent and your feet in footrests (stirrups).  A device will be inserted into your vagina to spread apart the vaginal walls (speculum). This will allow your health care provider to see the strings attached to the IUD.  Your health care provider will use  a small instrument (forceps) to grasp the IUD strings and pull firmly until the IUD is removed. You may have some discomfort when the IUD is removed. Your health care provider may recommend taking over-the-counter pain relievers, such as ibuprofen,  before the procedure. You may also have minor spotting for a few days after the procedure. The exact procedure for IUD removal may vary among health care providers and hospitals. Is the IUD right for me? Your health care provider will make sure you are a good candidate for an IUD and will discuss the advantages, disadvantages, and possible side effects with you. Summary  An intrauterine device (IUD) is a medical device that is inserted in the uterus to prevent pregnancy. It is a small, T-shaped device that has one or two nylon strings hanging down from it.  A hormone IUD contains the hormone progestin (synthetic progesterone). A copper IUD has copper wire wrapped around it.  Synthetic progesterone in a hormone IUD prevents pregnancy by thickening cervical mucus and thinning the walls of the uterus. Copper in a copper IUD prevents pregnancy by making the uterus and fallopian tubes produce a fluid that kills sperm.  A hormone IUD can be left in place for 3-5 years. A copper IUD can be left in place for up to 10 years.  An IUD is inserted and removed by a health care provider. You may feel some pain during insertion and removal. Your health care provider may recommend taking over-the-counter pain medicine, such as ibuprofen, before an IUD procedure. This information is not intended to replace advice given to you by your health care provider. Make sure you discuss any questions you have with your health care provider. Document Revised: 06/05/2017 Document Reviewed: 07/22/2016 Elsevier Patient Education  2020 [employer].

## 2020-03-08 LAB — CERVICOVAGINAL ANCILLARY ONLY
Chlamydia: NEGATIVE
Comment: NEGATIVE
Comment: NEGATIVE
Comment: NORMAL
Neisseria Gonorrhea: NEGATIVE
Trichomonas: NEGATIVE

## 2020-03-21 ENCOUNTER — Ambulatory Visit (INDEPENDENT_AMBULATORY_CARE_PROVIDER_SITE_OTHER): Payer: BC Managed Care – PPO | Admitting: Advanced Practice Midwife

## 2020-03-21 ENCOUNTER — Other Ambulatory Visit: Payer: Self-pay

## 2020-03-21 VITALS — BP 115/73 | HR 71 | Wt 138.2 lb

## 2020-03-21 DIAGNOSIS — Z3A32 32 weeks gestation of pregnancy: Secondary | ICD-10-CM

## 2020-03-21 DIAGNOSIS — O99891 Other specified diseases and conditions complicating pregnancy: Secondary | ICD-10-CM

## 2020-03-21 DIAGNOSIS — M549 Dorsalgia, unspecified: Secondary | ICD-10-CM

## 2020-03-21 DIAGNOSIS — Z3400 Encounter for supervision of normal first pregnancy, unspecified trimester: Secondary | ICD-10-CM

## 2020-03-21 MED ORDER — COMFORT FIT MATERNITY SUPP MED MISC: 1.0000 | Freq: Every day | 0 refills | Status: DC

## 2020-03-21 NOTE — Patient Instructions (Signed)
For childbirth and other classes, go to [email or web address].   Breastfeeding resources:  Www.llli.org ([employer]) Www.kellymom.com  Breastfeeding  Choosing to breastfeed is one of the best decisions you can make for yourself and your baby. A change in hormones during pregnancy causes your breasts to make breast milk in your milk-producing glands. Hormones prevent breast milk from being released before your baby is born. They also prompt milk flow after birth. Once breastfeeding has begun, thoughts of your baby, as well as his or her sucking or crying, can stimulate the release of milk from your milk-producing glands. Benefits of breastfeeding Research shows that breastfeeding offers many health benefits for infants and mothers. It also offers a cost-free and convenient way to feed your baby. For your baby  Your first milk (colostrum) helps your baby's digestive system to function better.  Special cells in your milk (antibodies) help your baby to fight off infections.  Breastfed babies are less likely to develop asthma, allergies, obesity, or type 2 diabetes. They are also at lower risk for sudden infant death syndrome (SIDS).  Nutrients in breast milk are better able to meet your babys needs compared to infant formula.  Breast milk improves your baby's brain development. For you  Breastfeeding helps to create a very special bond between you and your baby.  Breastfeeding is convenient. Breast milk costs nothing and is always available at the correct temperature.  Breastfeeding helps to burn calories. It helps you to lose the weight that you gained during pregnancy.  Breastfeeding makes your uterus return faster to its size before pregnancy. It also slows bleeding (lochia) after you give birth.  Breastfeeding helps to lower your risk of developing type 2 diabetes, osteoporosis, rheumatoid arthritis, cardiovascular disease, and breast, ovarian, uterine, and endometrial  cancer later in life. Breastfeeding basics Starting breastfeeding  Find a comfortable place to sit or lie down, with your neck and back well-supported.  Place a pillow or a rolled-up blanket under your baby to bring him or her to the level of your breast (if you are seated). Nursing pillows are specially designed to help support your arms and your baby while you breastfeed.  Make sure that your baby's tummy (abdomen) is facing your abdomen.  Gently massage your breast. With your fingertips, massage from the outer edges of your breast inward toward the nipple. This encourages milk flow. If your milk flows slowly, you may need to continue this action during the feeding.  Support your breast with 4 fingers underneath and your thumb above your nipple (make the letter "C" with your hand). Make sure your fingers are well away from your nipple and your babys mouth.  Stroke your baby's lips gently with your finger or nipple.  When your baby's mouth is open wide enough, quickly bring your baby to your breast, placing your entire nipple and as much of the areola as possible into your baby's mouth. The areola is the colored area around your nipple. ? More areola should be visible above your baby's upper lip than below the lower lip. ? Your baby's lips should be opened and extended outward (flanged) to ensure an adequate, comfortable latch. ? Your baby's tongue should be between his or her lower gum and your breast.  Make sure that your baby's mouth is correctly positioned around your nipple (latched). Your baby's lips should create a seal on your breast and be turned out (everted).  It is common for your baby to suck about 2-3 minutes in order  to start the flow of breast milk. Latching Teaching your baby how to latch onto your breast properly is very important. An improper latch can cause nipple pain, decreased milk supply, and poor weight gain in your baby. Also, if your baby is not latched onto your  nipple properly, he or she may swallow some air during feeding. This can make your baby fussy. Burping your baby when you switch breasts during the feeding can help to get rid of the air. However, teaching your baby to latch on properly is still the best way to prevent fussiness from swallowing air while breastfeeding. Signs that your baby has successfully latched onto your nipple  Silent tugging or silent sucking, without causing you pain. Infant's lips should be extended outward (flanged).  Swallowing heard between every 3-4 sucks once your milk has started to flow (after your let-down milk reflex occurs).  Muscle movement above and in front of his or her ears while sucking. Signs that your baby has not successfully latched onto your nipple  Sucking sounds or smacking sounds from your baby while breastfeeding.  Nipple pain. If you think your baby has not latched on correctly, slip your finger into the corner of your babys mouth to break the suction and place it between your baby's gums. Attempt to start breastfeeding again. Signs of successful breastfeeding Signs from your baby  Your baby will gradually decrease the number of sucks or will completely stop sucking.  Your baby will fall asleep.  Your baby's body will relax.  Your baby will retain a small amount of milk in his or her mouth.  Your baby will let go of your breast by himself or herself. Signs from you  Breasts that have increased in firmness, weight, and size 1-3 hours after feeding.  Breasts that are softer immediately after breastfeeding.  Increased milk volume, as well as a change in milk consistency and color by the fifth day of breastfeeding.  Nipples that are not sore, cracked, or bleeding. Signs that your baby is getting enough milk  Wetting at least 1-2 diapers during the first 24 hours after birth.  Wetting at least 5-6 diapers every 24 hours for the first week after birth. The urine should be clear or  pale yellow by the age of 5 days.  Wetting 6-8 diapers every 24 hours as your baby continues to grow and develop.  At least 3 stools in a 24-hour period by the age of 5 days. The stool should be soft and yellow.  At least 3 stools in a 24-hour period by the age of 7 days. The stool should be seedy and yellow.  No loss of weight greater than 10% of birth weight during the first 3 days of life.  Average weight gain of 4-7 oz (113-198 g) per week after the age of 4 days.  Consistent daily weight gain by the age of 5 days, without weight loss after the age of 2 weeks. After a feeding, your baby may spit up a small amount of milk. This is normal. Breastfeeding frequency and duration Frequent feeding will help you make more milk and can prevent sore nipples and extremely full breasts (breast engorgement). Breastfeed when you feel the need to reduce the fullness of your breasts or when your baby shows signs of hunger. This is called "breastfeeding on demand." Signs that your baby is hungry include:  Increased alertness, activity, or restlessness.  Movement of the head from side to side.  Opening of the  mouth when the corner of the mouth or cheek is stroked (rooting).  Increased sucking sounds, smacking lips, cooing, sighing, or squeaking.  Hand-to-mouth movements and sucking on fingers or hands.  Fussing or crying. Avoid introducing a pacifier to your baby in the first 4-6 weeks after your baby is born. After this time, you may choose to use a pacifier. Research has shown that pacifier use during the first year of a baby's life decreases the risk of sudden infant death syndrome (SIDS). Allow your baby to feed on each breast as long as he or she wants. When your baby unlatches or falls asleep while feeding from the first breast, offer the second breast. Because newborns are often sleepy in the first few weeks of life, you may need to awaken your baby to get him or her to feed. Breastfeeding  times will vary from baby to baby. However, the following rules can serve as a guide to help you make sure that your baby is properly fed:  Newborns (babies 73 weeks of age or younger) may breastfeed every 1-3 hours.  Newborns should not go without breastfeeding for longer than 3 hours during the day or 5 hours during the night.  You should breastfeed your baby a minimum of 8 times in a 24-hour period. Breast milk pumping     Pumping and storing breast milk allows you to make sure that your baby is exclusively fed your breast milk, even at times when you are unable to breastfeed. This is especially important if you go back to work while you are still breastfeeding, or if you are not able to be present during feedings. Your lactation consultant can help you find a method of pumping that works best for you and give you guidelines about how long it is safe to store breast milk. Caring for your breasts while you breastfeed Nipples can become dry, cracked, and sore while breastfeeding. The following recommendations can help keep your breasts moisturized and healthy:  Avoid using soap on your nipples.  Wear a supportive bra designed especially for nursing. Avoid wearing underwire-style bras or extremely tight bras (sports bras).  Air-dry your nipples for 3-4 minutes after each feeding.  Use only cotton bra pads to absorb leaked breast milk. Leaking of breast milk between feedings is normal.  Use lanolin on your nipples after breastfeeding. Lanolin helps to maintain your skin's normal moisture barrier. Pure lanolin is not harmful (not toxic) to your baby. You may also hand express a few drops of breast milk and gently massage that milk into your nipples and allow the milk to air-dry. In the first few weeks after giving birth, some women experience breast engorgement. Engorgement can make your breasts feel heavy, warm, and tender to the touch. Engorgement peaks within 3-5 days after you give birth.  The following recommendations can help to ease engorgement:  Completely empty your breasts while breastfeeding or pumping. You may want to start by applying warm, moist heat (in the shower or with warm, water-soaked hand towels) just before feeding or pumping. This increases circulation and helps the milk flow. If your baby does not completely empty your breasts while breastfeeding, pump any extra milk after he or she is finished.  Apply ice packs to your breasts immediately after breastfeeding or pumping, unless this is too uncomfortable for you. To do this: ? Put ice in a plastic bag. ? Place a towel between your skin and the bag. ? Leave the ice on for 20 minutes,  2-3 times a day.  Make sure that your baby is latched on and positioned properly while breastfeeding. If engorgement persists after 48 hours of following these recommendations, contact your health care provider or a Advertising copywriter. Overall health care recommendations while breastfeeding  Eat 3 healthy meals and 3 snacks every day. Well-nourished mothers who are breastfeeding need an additional 450-500 calories a day. You can meet this requirement by increasing the amount of a balanced diet that you eat.  Drink enough water to keep your urine pale yellow or clear.  Rest often, relax, and continue to take your prenatal vitamins to prevent fatigue, stress, and low vitamin and mineral levels in your body (nutrient deficiencies).  Do not use any products that contain nicotine or tobacco, such as cigarettes and e-cigarettes. Your baby may be harmed by chemicals from cigarettes that pass into breast milk and exposure to secondhand smoke. If you need help quitting, ask your health care provider.  Avoid alcohol.  Do not use illegal drugs or marijuana.  Talk with your health care provider before taking any medicines. These include over-the-counter and prescription medicines as well as vitamins and herbal supplements. Some medicines  that may be harmful to your baby can pass through breast milk.  It is possible to become pregnant while breastfeeding. If birth control is desired, ask your health care provider about options that will be safe while breastfeeding your baby. Where to find more information: [employer]: www.llli.org Contact a health care provider if:  You feel like you want to stop breastfeeding or have become frustrated with breastfeeding.  Your nipples are cracked or bleeding.  Your breasts are red, tender, or warm.  You have: ? Painful breasts or nipples. ? A swollen area on either breast. ? A fever or chills. ? Nausea or vomiting. ? Drainage other than breast milk from your nipples.  Your breasts do not become full before feedings by the fifth day after you give birth.  You feel sad and depressed.  Your baby is: ? Too sleepy to eat well. ? Having trouble sleeping. ? More than 47 week old and wetting fewer than 6 diapers in a 24-hour period. ? Not gaining weight by 56 days of age.  Your baby has fewer than 3 stools in a 24-hour period.  Your baby's skin or the white parts of his or her eyes become yellow. Get help right away if:  Your baby is overly tired (lethargic) and does not want to wake up and feed.  Your baby develops an unexplained fever. Summary  Breastfeeding offers many health benefits for infant and mothers.  Try to breastfeed your infant when he or she shows early signs of hunger.  Gently tickle or stroke your baby's lips with your finger or nipple to allow the baby to open his or her mouth. Bring the baby to your breast. Make sure that much of the areola is in your baby's mouth. Offer one side and burp the baby before you offer the other side.  Talk with your health care provider or lactation consultant if you have questions or you face problems as you breastfeed. This information is not intended to replace advice given to you by your health care provider.  Make sure you discuss any questions you have with your health care provider. Document Revised: 09/17/2017 Document Reviewed: 07/25/2016 Elsevier Patient Education  2020 [employer].

## 2020-03-21 NOTE — Progress Notes (Signed)
Pt. Presents for ROB, [redacted]w[redacted]d. Pt. Wants to discuss breast feeding

## 2020-03-21 NOTE — Progress Notes (Signed)
   PRENATAL VISIT NOTE  Subjective:  Diana Woodard is a 24 y.o. G1P0 at [redacted]w[redacted]d being seen today for ongoing prenatal care.  She is currently monitored for the following issues for this low-risk pregnancy and has Supervision of normal pregnancy, antepartum; ASCUS with positive high risk HPV cervical; Echogenic intracardiac focus of fetus on prenatal ultrasound; Chlamydia infection affecting pregnancy; and Depression affecting pregnancy on their problem list.  Patient reports backache.  Contractions: Not present. Vag. Bleeding: None.  Movement: Present. Denies leaking of fluid.   The following portions of the patient's history were reviewed and updated as appropriate: allergies, current medications, past family history, past medical history, past social history, past surgical history and problem list.   Objective:   Vitals:   03/21/20 1101  BP: 115/73  Pulse: 71  Weight: 138 lb 3.2 oz (62.7 kg)    Fetal Status:     Movement: Present     General:  Alert, oriented and cooperative. Patient is in no acute distress.  Skin: Skin is warm and dry. No rash noted.   Cardiovascular: Normal heart rate noted  Respiratory: Normal respiratory effort, no problems with respiration noted  Abdomen: Soft, gravid, appropriate for gestational age.  Pain/Pressure: Absent     Pelvic: Cervical exam deferred        Extremities: Normal range of motion.  Edema: Trace  Mental Status: Normal mood and affect. Normal behavior. Normal judgment and thought content.   Assessment and Plan:  Pregnancy: G1P0 at [redacted]w[redacted]d 1. Back pain affecting pregnancy in third trimester --Rest/ice/heat/warm bath/Tylenol/pregnancy support belt  - Elastic Bandages & Supports (COMFORT FIT MATERNITY SUPP MED) MISC; 1 Device by Does not apply route daily.  Dispense: 1 each; Refill: 0  2. [redacted] weeks gestation of pregnancy   3. Supervision of normal first pregnancy, antepartum --Anticipatory guidance about next visits/weeks of pregnancy  given. --Next visit in 4 weeks in office for GBS  Preterm labor symptoms and general obstetric precautions including but not limited to vaginal bleeding, contractions, leaking of fluid and fetal movement were reviewed in detail with the patient. Please refer to After Visit Summary for other counseling recommendations.   Return in about 4 weeks (around 04/18/2020).  No future appointments.  Sharen Counter, CNM

## 2020-04-18 ENCOUNTER — Other Ambulatory Visit: Payer: Self-pay

## 2020-04-18 ENCOUNTER — Ambulatory Visit (INDEPENDENT_AMBULATORY_CARE_PROVIDER_SITE_OTHER): Payer: BC Managed Care – PPO | Admitting: Obstetrics and Gynecology

## 2020-04-18 ENCOUNTER — Other Ambulatory Visit (HOSPITAL_COMMUNITY)
Admission: RE | Admit: 2020-04-18 | Discharge: 2020-04-18 | Disposition: A | Discharge status: Home / Self Care | Payer: BC Managed Care – PPO | Source: Ambulatory Visit | Attending: Obstetrics and Gynecology | Admitting: Obstetrics and Gynecology

## 2020-04-18 VITALS — BP 115/76 | HR 66 | Wt 145.4 lb

## 2020-04-18 DIAGNOSIS — Z23 Encounter for immunization: Secondary | ICD-10-CM

## 2020-04-18 DIAGNOSIS — Z3A36 36 weeks gestation of pregnancy: Secondary | ICD-10-CM | POA: Diagnosis not present

## 2020-04-18 DIAGNOSIS — Z3400 Encounter for supervision of normal first pregnancy, unspecified trimester: Secondary | ICD-10-CM

## 2020-04-18 NOTE — Progress Notes (Signed)
Patient presents for ROB and GBS cultures. Flu and Tdap vaccine given today. Patient has no concerns.

## 2020-04-18 NOTE — Progress Notes (Signed)
   PRENATAL VISIT NOTE  Subjective:  Diana Woodard is a 24 y.o. G1P0 at [redacted]w[redacted]d being seen today for ongoing prenatal care.  She is currently monitored for the following issues for this low-risk pregnancy and has Supervision of normal pregnancy, antepartum; ASCUS with positive high risk HPV cervical; Echogenic intracardiac focus of fetus on prenatal ultrasound; Chlamydia infection affecting pregnancy; and Depression affecting pregnancy on their problem list.  Patient reports no complaints.  Contractions: Not present. Vag. Bleeding: None.  Movement: Present. Denies leaking of fluid.   The following portions of the patient's history were reviewed and updated as appropriate: allergies, current medications, past family history, past medical history, past social history, past surgical history and problem list.   Objective:   Vitals:   04/18/20 1054  BP: 115/76  Pulse: 66  Weight: 145 lb 6.4 oz (66 kg)    Fetal Status:   Fundal Height: 36 cm Movement: Present     General:  Alert, oriented and cooperative. Patient is in no acute distress.  Skin: Skin is warm and dry. No rash noted.   Cardiovascular: Normal heart rate noted  Respiratory: Normal respiratory effort, no problems with respiration noted  Abdomen: Soft, gravid, appropriate for gestational age.  Pain/Pressure: Present     Pelvic: Cervical exam deferred        Extremities: Normal range of motion.  Edema: Trace  Mental Status: Normal mood and affect. Normal behavior. Normal judgment and thought content.   Assessment and Plan:  Pregnancy: G1P0 at [redacted]w[redacted]d 1. [redacted] weeks gestation of pregnancy  - Culture, beta strep (group b only) - Cervicovaginal ancillary only( [facility])  2. Supervision of normal first pregnancy, antepartum  Discussed labor and delivery. Patient would like to try natural labor.   Preterm labor symptoms and general obstetric precautions including but not limited to vaginal bleeding, contractions, leaking of fluid  and fetal movement were reviewed in detail with the patient. Please refer to After Visit Summary for other counseling recommendations.   No follow-ups on file.  Future Appointments  Date Time Provider Department Center  04/25/2020  8:45 AM Warden Fillers, MD [facility] None  05/02/2020  8:55 AM Barb Merino, Skipper Cliche, MD [facility] None    Venia Carbon, NP

## 2020-04-19 LAB — CERVICOVAGINAL ANCILLARY ONLY
Chlamydia: NEGATIVE
Comment: NEGATIVE
Comment: NORMAL
Neisseria Gonorrhea: NEGATIVE

## 2020-04-22 LAB — CULTURE, BETA STREP (GROUP B ONLY): Strep Gp B Culture: NEGATIVE

## 2020-04-25 ENCOUNTER — Other Ambulatory Visit: Payer: Self-pay

## 2020-04-25 ENCOUNTER — Encounter: Payer: Self-pay | Admitting: Obstetrics and Gynecology

## 2020-04-25 ENCOUNTER — Ambulatory Visit (INDEPENDENT_AMBULATORY_CARE_PROVIDER_SITE_OTHER): Payer: BC Managed Care – PPO | Admitting: Obstetrics and Gynecology

## 2020-04-25 VITALS — BP 122/73 | HR 65 | Wt 149.6 lb

## 2020-04-25 DIAGNOSIS — Z3A37 37 weeks gestation of pregnancy: Secondary | ICD-10-CM

## 2020-04-25 DIAGNOSIS — Z3400 Encounter for supervision of normal first pregnancy, unspecified trimester: Secondary | ICD-10-CM

## 2020-04-25 NOTE — Progress Notes (Signed)
   LOW-RISK PREGNANCY OFFICE VISIT Patient name: Diana Woodard MRN [id number]  Date of birth: 1996/01/15 Chief Complaint:   Routine Prenatal Visit  History of Present Illness:   Diana Woodard is a 24 y.o. G1P0 female at [redacted]w[redacted]d with an Estimated Date of Delivery: 05/15/20 being seen today for ongoing management of a low-risk pregnancy.  Today she reports occasional contractions. Contractions: Not present. Vag. Bleeding: None.  Movement: Present. denies leaking of fluid. Review of Systems:   Pertinent items are noted in HPI Denies abnormal vaginal discharge w/ itching/odor/irritation, headaches, visual changes, shortness of breath, chest pain, abdominal pain, severe nausea/vomiting, or problems with urination or bowel movements unless otherwise stated above. Pertinent History Reviewed:  Reviewed past medical,surgical, social, obstetrical and family history.  Reviewed problem list, medications and allergies. Physical Assessment:   Vitals:   04/25/20 0901  BP: 122/73  Pulse: 65  Weight: 149 lb 9.6 oz (67.9 kg)  Body mass index is 26.5 kg/m.        Physical Examination:   General appearance: Well appearing, and in no distress  Mental status: Alert, oriented to person, place, and time  Skin: Warm & dry  Cardiovascular: Normal heart rate noted  Respiratory: Normal respiratory effort, no distress  Abdomen: Soft, gravid, nontender  Pelvic: Cervical exam deferred         Extremities: Edema: Trace  Fetal Status: Fetal Heart Rate (bpm): 138   Movement: Present    No results found for this or any previous visit (from the past 24 hour(s)).  Assessment & Plan:  1) Low-risk pregnancy G1P0 at [redacted]w[redacted]d with an Estimated Date of Delivery: 05/15/20   2) Supervision of normal first pregnancy, antepartum - Discussed labor plans/expectations; UC timing 5-1-1  3) [redacted] weeks gestation of pregnancy    Meds: No orders of the defined types were placed in this encounter.  Labs/procedures today:  none  Plan:  Continue routine obstetrical care   Reviewed: Term labor symptoms and general obstetric precautions including but not limited to vaginal bleeding, contractions, leaking of fluid and fetal movement were reviewed in detail with the patient.  All questions were answered. Has home bp cuff. Check bp weekly, let us know if >140/90.   Follow-up: Return in about 1 week (around 05/02/2020) for Return OB visit.  No orders of the defined types were placed in this encounter.  Raelyn Mora MSN, CNM 04/25/2020 9:27 AM

## 2020-04-25 NOTE — Patient Instructions (Signed)

## 2020-05-02 ENCOUNTER — Encounter: Payer: Self-pay | Admitting: Obstetrics and Gynecology

## 2020-05-02 ENCOUNTER — Other Ambulatory Visit: Payer: Self-pay

## 2020-05-02 ENCOUNTER — Ambulatory Visit (INDEPENDENT_AMBULATORY_CARE_PROVIDER_SITE_OTHER): Payer: BC Managed Care – PPO | Admitting: Obstetrics and Gynecology

## 2020-05-02 VITALS — BP 118/73 | HR 80 | Wt 149.0 lb

## 2020-05-02 DIAGNOSIS — O26843 Uterine size-date discrepancy, third trimester: Secondary | ICD-10-CM

## 2020-05-02 DIAGNOSIS — Z3400 Encounter for supervision of normal first pregnancy, unspecified trimester: Secondary | ICD-10-CM

## 2020-05-02 DIAGNOSIS — O99340 Other mental disorders complicating pregnancy, unspecified trimester: Secondary | ICD-10-CM

## 2020-05-02 DIAGNOSIS — Z3A38 38 weeks gestation of pregnancy: Secondary | ICD-10-CM

## 2020-05-02 DIAGNOSIS — A749 Chlamydial infection, unspecified: Secondary | ICD-10-CM

## 2020-05-02 DIAGNOSIS — O98819 Other maternal infectious and parasitic diseases complicating pregnancy, unspecified trimester: Secondary | ICD-10-CM

## 2020-05-02 DIAGNOSIS — F32A Depression, unspecified: Secondary | ICD-10-CM

## 2020-05-02 DIAGNOSIS — R87810 Cervical high risk human papillomavirus (HPV) DNA test positive: Secondary | ICD-10-CM

## 2020-05-02 DIAGNOSIS — R87610 Atypical squamous cells of undetermined significance on cytologic smear of cervix (ASC-US): Secondary | ICD-10-CM

## 2020-05-02 DIAGNOSIS — O283 Abnormal ultrasonic finding on antenatal screening of mother: Secondary | ICD-10-CM

## 2020-05-02 NOTE — Patient Instructions (Signed)
Third Trimester of Pregnancy The third trimester is from week 28 through week 40 (months 7 through 9). The third trimester is a time when the unborn baby (fetus) is growing rapidly. At the end of the ninth month, the fetus is about 20 inches in length and weighs 6-10 pounds. Body changes during your third trimester Your body will continue to go through many changes during pregnancy. The changes vary from woman to woman. During the third trimester:  Your weight will continue to increase. You can expect to gain 25-35 pounds (11-16 kg) by the end of the pregnancy.  You may begin to get stretch marks on your hips, abdomen, and breasts.  You may urinate more often because the fetus is moving lower into your pelvis and pressing on your bladder.  You may develop or continue to have heartburn. This is caused by increased hormones that slow down muscles in the digestive tract.  You may develop or continue to have constipation because increased hormones slow digestion and cause the muscles that push waste through your intestines to relax.  You may develop hemorrhoids. These are swollen veins (varicose veins) in the rectum that can itch or be painful.  You may develop swollen, bulging veins (varicose veins) in your legs.  You may have increased body aches in the pelvis, back, or thighs. This is due to weight gain and increased hormones that are relaxing your joints.  You may have changes in your hair. These can include thickening of your hair, rapid growth, and changes in texture. Some women also have hair loss during or after pregnancy, or hair that feels dry or thin. Your hair will most likely return to normal after your baby is born.  Your breasts will continue to grow and they will continue to become tender. A yellow fluid (colostrum) may leak from your breasts. This is the first milk you are producing for your baby.  Your belly button may stick out.  You may notice more swelling in your hands,  face, or ankles.  You may have increased tingling or numbness in your hands, arms, and legs. The skin on your belly may also feel numb.  You may feel short of breath because of your expanding uterus.  You may have more problems sleeping. This can be caused by the size of your belly, increased need to urinate, and an increase in your body's metabolism.  You may notice the fetus "dropping," or moving lower in your abdomen (lightening).  You may have increased vaginal discharge.  You may notice your joints feel loose and you may have pain around your pelvic bone. What to expect at prenatal visits You will have prenatal exams every 2 weeks until week 36. Then you will have weekly prenatal exams. During a routine prenatal visit:  You will be weighed to make sure you and the baby are growing normally.  Your blood pressure will be taken.  Your abdomen will be measured to track your baby's growth.  The fetal heartbeat will be listened to.  Any test results from the previous visit will be discussed.  You may have a cervical check near your due date to see if your cervix has softened or thinned (effaced).  You will be tested for Group B streptococcus. This happens between 35 and 37 weeks. Your health care provider may ask you:  What your birth plan is.  How you are feeling.  If you are feeling the baby move.  If you have had any abnormal   symptoms, such as leaking fluid, bleeding, severe headaches, or abdominal cramping.  If you are using any tobacco products, including cigarettes, chewing tobacco, and electronic cigarettes.  If you have any questions. Other tests or screenings that may be performed during your third trimester include:  Blood tests that check for low iron levels (anemia).  Fetal testing to check the health, activity level, and growth of the fetus. Testing is done if you have certain medical conditions or if there are problems during the pregnancy.  Nonstress test  (NST). This test checks the health of your baby to make sure there are no signs of problems, such as the baby not getting enough oxygen. During this test, a belt is placed around your belly. The baby is made to move, and its heart rate is monitored during movement. What is false labor? False labor is a condition in which you feel small, irregular tightenings of the muscles in the womb (contractions) that usually go away with rest, changing position, or drinking water. These are called Braxton Hicks contractions. Contractions may last for hours, days, or even weeks before true labor sets in. If contractions come at regular intervals, become more frequent, increase in intensity, or become painful, you should see your health care provider. What are the signs of labor?  Abdominal cramps.  Regular contractions that start at 10 minutes apart and become stronger and more frequent with time.  Contractions that start on the top of the uterus and spread down to the lower abdomen and back.  Increased pelvic pressure and dull back pain.  A watery or bloody mucus discharge that comes from the vagina.  Leaking of amniotic fluid. This is also known as your "water breaking." It could be a slow trickle or a gush. Let your health care provider know if it has a color or strange odor. If you have any of these signs, call your health care provider right away, even if it is before your due date. Follow these instructions at home: Medicines  Follow your health care provider's instructions regarding medicine use. Specific medicines may be either safe or unsafe to take during pregnancy.  Take a prenatal vitamin that contains at least 600 micrograms (mcg) of folic acid.  If you develop constipation, try taking a stool softener if your health care provider approves. Eating and drinking   Eat a balanced diet that includes fresh fruits and vegetables, whole grains, good sources of protein such as meat, eggs, or tofu,  and low-fat dairy. Your health care provider will help you determine the amount of weight gain that is right for you.  Avoid raw meat and uncooked cheese. These carry germs that can cause birth defects in the baby.  If you have low calcium intake from food, talk to your health care provider about whether you should take a daily calcium supplement.  Eat four or five small meals rather than three large meals a day.  Limit foods that are high in fat and processed sugars, such as fried and sweet foods.  To prevent constipation: ? Drink enough fluid to keep your urine clear or pale yellow. ? Eat foods that are high in fiber, such as fresh fruits and vegetables, whole grains, and beans. Activity  Exercise only as directed by your health care provider. Most women can continue their usual exercise routine during pregnancy. Try to exercise for 30 minutes at least 5 days a week. Stop exercising if you experience uterine contractions.  Avoid heavy lifting.  Do   not exercise in extreme heat or humidity, or at high altitudes.  Wear low-heel, comfortable shoes.  Practice good posture.  You may continue to have sex unless your health care provider tells you otherwise. Relieving pain and discomfort  Take frequent breaks and rest with your legs elevated if you have leg cramps or low back pain.  Take warm sitz baths to soothe any pain or discomfort caused by hemorrhoids. Use hemorrhoid cream if your health care provider approves.  Wear a good support bra to prevent discomfort from breast tenderness.  If you develop varicose veins: ? Wear support pantyhose or compression stockings as told by your healthcare provider. ? Elevate your feet for 15 minutes, 3-4 times a day. Prenatal care  Write down your questions. Take them to your prenatal visits.  Keep all your prenatal visits as told by your health care provider. This is important. Safety  Wear your seat belt at all times when driving.  Make  a list of emergency phone numbers, including numbers for family, friends, the hospital, and police and fire departments. General instructions  Avoid cat litter boxes and soil used by cats. These carry germs that can cause birth defects in the baby. If you have a cat, ask someone to clean the litter box for you.  Do not travel far distances unless it is absolutely necessary and only with the approval of your health care provider.  Do not use hot tubs, steam rooms, or saunas.  Do not drink alcohol.  Do not use any products that contain nicotine or tobacco, such as cigarettes and e-cigarettes. If you need help quitting, ask your health care provider.  Do not use any medicinal herbs or unprescribed drugs. These chemicals affect the formation and growth of the baby.  Do not douche or use tampons or scented sanitary pads.  Do not cross your legs for long periods of time.  To prepare for the arrival of your baby: ? Take prenatal classes to understand, practice, and ask questions about labor and delivery. ? Make a trial run to the hospital. ? Visit the hospital and tour the maternity area. ? Arrange for maternity or paternity leave through employers. ? Arrange for family and friends to take care of pets while you are in the hospital. ? Purchase a rear-facing car seat and make sure you know how to install it in your car. ? Pack your hospital bag. ? Prepare the baby's nursery. Make sure to remove all pillows and stuffed animals from the baby's crib to prevent suffocation.  Visit your dentist if you have not gone during your pregnancy. Use a soft toothbrush to brush your teeth and be gentle when you floss. Contact a health care provider if:  You are unsure if you are in labor or if your water has broken.  You become dizzy.  You have mild pelvic cramps, pelvic pressure, or nagging pain in your abdominal area.  You have lower back pain.  You have persistent nausea, vomiting, or  diarrhea.  You have an unusual or bad smelling vaginal discharge.  You have pain when you urinate. Get help right away if:  Your water breaks before 37 weeks.  You have regular contractions less than 5 minutes apart before 37 weeks.  You have a fever.  You are leaking fluid from your vagina.  You have spotting or bleeding from your vagina.  You have severe abdominal pain or cramping.  You have rapid weight loss or weight gain.  You have   shortness of breath with chest pain.  You notice sudden or extreme swelling of your face, hands, ankles, feet, or legs.  Your baby makes fewer than 10 movements in 2 hours.  You have severe headaches that do not go away when you take medicine.  You have vision changes. Summary  The third trimester is from week 28 through week 40, months 7 through 9. The third trimester is a time when the unborn baby (fetus) is growing rapidly.  During the third trimester, your discomfort may increase as you and your baby continue to gain weight. You may have abdominal, leg, and back pain, sleeping problems, and an increased need to urinate.  During the third trimester your breasts will keep growing and they will continue to become tender. A yellow fluid (colostrum) may leak from your breasts. This is the first milk you are producing for your baby.  False labor is a condition in which you feel small, irregular tightenings of the muscles in the womb (contractions) that eventually go away. These are called Braxton Hicks contractions. Contractions may last for hours, days, or even weeks before true labor sets in.  Signs of labor can include: abdominal cramps; regular contractions that start at 10 minutes apart and become stronger and more frequent with time; watery or bloody mucus discharge that comes from the vagina; increased pelvic pressure and dull back pain; and leaking of amniotic fluid. This information is not intended to replace advice given to you by your  health care provider. Make sure you discuss any questions you have with your health care provider. Document Revised: 10/14/2018 Document Reviewed: 07/29/2016 Elsevier Patient Education  2020 [employer]   Fetal Movement Counts  What is a fetal movement count?  A fetal movement count is the number of times that you feel your baby move during a certain amount of time. This may also be called a fetal kick count. A fetal movement count is recommended for every pregnant woman. You may be asked to start counting fetal movements as early as week 28 of your pregnancy. Pay attention to when your baby is most active. You may notice your baby's sleep and wake cycles. You may also notice things that make your baby move more. You should do a fetal movement count:  When your baby is normally most active.  At the same time each day. A good time to count movements is while you are resting, after having something to eat and drink. How do I count fetal movements? 1. Find a quiet, comfortable area. Sit, or lie down on your side. 2. Write down the date, the start time and stop time, and the number of movements that you felt between those two times. Take this information with you to your health care visits. 3. Write down your start time when you feel the first movement. 4. Count kicks, flutters, swishes, rolls, and jabs. You should feel at least 10 movements. 5. You may stop counting after you have felt 10 movements, or if you have been counting for 2 hours. Write down the stop time. 6. If you do not feel 10 movements in 2 hours, contact your health care provider for further instructions. Your health care provider may want to do additional tests to assess your baby's well-being. Contact a health care provider if:  You feel fewer than 10 movements in 2 hours.  Your baby is not moving like he or she usually does. Date: ____________ Start time: ____________ Stop time: ____________ Movements:    ____________ Date: ____________ Start time: ____________ Stop time: ____________ Movements: ____________ Date: ____________ Start time: ____________ Stop time: ____________ Movements: ____________ Date: ____________ Start time: ____________ Stop time: ____________ Movements: ____________ Date: ____________ Start time: ____________ Stop time: ____________ Movements: ____________ Date: ____________ Start time: ____________ Stop time: ____________ Movements: ____________ Date: ____________ Start time: ____________ Stop time: ____________ Movements: ____________ Date: ____________ Start time: ____________ Stop time: ____________ Movements: ____________ Date: ____________ Start time: ____________ Stop time: ____________ Movements: ____________ This information is not intended to replace advice given to you by your health care provider. Make sure you discuss any questions you have with your health care provider. Document Revised: 02/10/2019 Document Reviewed: 02/10/2019 Elsevier Patient Education  2020 Else[employer] Inc.  

## 2020-05-02 NOTE — Progress Notes (Signed)
   PRENATAL VISIT NOTE  Subjective:  Diana Woodard is a 24 y.o. G1P0 at [redacted]w[redacted]d being seen today for ongoing prenatal care.  She is currently monitored for the following issues for this high-risk pregnancy and has Supervision of normal pregnancy, antepartum; ASCUS with positive high risk HPV cervical; Echogenic intracardiac focus of fetus on prenatal ultrasound; Chlamydia infection affecting pregnancy; Depression affecting pregnancy; and [redacted] weeks gestation of pregnancy on their problem list.  Patient reports no complaints.  Contractions: Not present. Vag. Bleeding: None.  Movement: Present. Denies leaking of fluid.   The following portions of the patient's history were reviewed and updated as appropriate: allergies, current medications, past family history, past medical history, past social history, past surgical history and problem list.   Objective:   Vitals:   05/02/20 0852  BP: 118/73  Pulse: 80  Weight: 149 lb (67.6 kg)    Fetal Status: Fetal Heart Rate (bpm): 140   Movement: Present     General:  Alert, oriented and cooperative. Patient is in no acute distress.  Skin: Skin is warm and dry. No rash noted.   Cardiovascular: Normal heart rate noted  Respiratory: Normal respiratory effort, no problems with respiration noted  Abdomen: Soft, gravid, appropriate for gestational age.  Pain/Pressure: Absent     Pelvic: Cervical exam deferred        Extremities: Normal range of motion.  Edema: Trace  Mental Status: Normal mood and affect. Normal behavior. Normal judgment and thought content.   Assessment and Plan:  Pregnancy: G1P0 at [redacted]w[redacted]d 1. Supervision of normal first pregnancy, antepartum, [redacted] weeks gestation of pregnancy: No reported concerns today. No red flag symptoms. Anatomy scan wnl at [redacted]w[redacted]d with EFW 22%. Pt reports good fetal movement and +FHTs today. Confirmed vertex today. - continue prenatal vitamins - s/p Tdap & Flu vaccines - rtc 1 week for f/u prenatal appt; strict  return precautions for labor, decreased fetal movement, vaginal bleeding and other concerns - plan to schedule PD IOL at f/u prenatal appt  2. ASCUS with positive high risk HPV cervical: Abnormal pap result on 12/14/19. - plan for postpartum colpo  3. Echogenic intracardiac focus of fetus on prenatal ultrasound: Pt aware & reassuringly, low risk genetic screen.  4. Chlamydia infection affecting pregnancy, antepartum: S/p chlamydia infection with negative TOC. No recurrent GU symptoms today and recent GC/C screen negative at last prenatal appt.  5. Depression affecting pregnancy: No safety concerns reported today. No current medications. - provided strict return precautions for safety concerns & counseled on postpartum depression  Term labor symptoms and general obstetric precautions including but not limited to vaginal bleeding, contractions, leaking of fluid and fetal movement were reviewed in detail with the patient. Please refer to After Visit Summary for other counseling recommendations.   No follow-ups on file.  Future Appointments  Date Time Provider Department Center  05/09/2020  8:55 AM Leftwich-Kirby, Wilmer Floor, CNM [facility] None  05/16/2020  9:15 AM Burleson, Brand Males, NP [facility] None   Lorrin Bodner, Skipper Cliche, MD [facility] 05/02/2020 2:27 PM

## 2020-05-09 ENCOUNTER — Other Ambulatory Visit: Payer: Self-pay

## 2020-05-09 ENCOUNTER — Ambulatory Visit (INDEPENDENT_AMBULATORY_CARE_PROVIDER_SITE_OTHER): Payer: BC Managed Care – PPO | Admitting: Advanced Practice Midwife

## 2020-05-09 VITALS — BP 120/76 | HR 96 | Wt 151.0 lb

## 2020-05-09 DIAGNOSIS — Z3A39 39 weeks gestation of pregnancy: Secondary | ICD-10-CM

## 2020-05-09 DIAGNOSIS — Z3400 Encounter for supervision of normal first pregnancy, unspecified trimester: Secondary | ICD-10-CM

## 2020-05-09 DIAGNOSIS — G5600 Carpal tunnel syndrome, unspecified upper limb: Secondary | ICD-10-CM

## 2020-05-09 DIAGNOSIS — O26899 Other specified pregnancy related conditions, unspecified trimester: Secondary | ICD-10-CM

## 2020-05-09 NOTE — Progress Notes (Signed)
Pt is having some Carpal symptoms in her right hand/arm.

## 2020-05-09 NOTE — Patient Instructions (Signed)
Things to Try After 37 weeks to Encourage Labor/Get Ready for Labor:   1.  Try the Miles Circuit at www.milescircuit.com daily to improve baby's position and encourage the onset of labor.  2. Walk a [place] and rest a [place] every day.  Change positions often.  3. Cervical Ripening: May try one or both a. Red Raspberry Leaf capsules or tea:  two 300mg or 400mg tablets with each meal, 2-3 times a day, or 1-3 cups of tea daily  Potential Side Effects Of Raspberry Leaf:  Most women do not experience any side effects from drinking raspberry leaf tea. However, nausea and loose stools are possible   b. Evening Primrose Oil capsules: may take 1 to 3 capsules daily. Take 1-2 capsules by mouth each day and place one capsule vaginally at night.  You may also prick the vaginal capsule to release the oil prior to inserting in the vagina. Some of the potential side effects:  Upset stomach  Loose stools or diarrhea  Headaches  Nausea  4. Sex (and especially sex with orgasm) can also help the cervix ripen and encourage labor onset.  Labor Precautions Reasons to come to [facility] at [facility]:  1.  Contractions are  5 minutes apart or less, each last 1 minute, these have been going on for 1-2 hours, and you cannot walk or talk during them 2.  You have a large gush of fluid, or a trickle of fluid that will not stop and you have to wear a pad 3.  You have bleeding that is bright red, heavier than spotting--like menstrual bleeding (spotting can be normal in early labor or after a check of your cervix) 4.  You do not feel the baby moving like he/she normally does 

## 2020-05-09 NOTE — Progress Notes (Signed)
   PRENATAL VISIT NOTE  Subjective:  Diana Woodard is a 24 y.o. G1P0 at [redacted]w[redacted]d being seen today for ongoing prenatal care.  She is currently monitored for the following issues for this  low-risk pregnancy and has Supervision of normal pregnancy, antepartum; ASCUS with positive high risk HPV cervical; Echogenic intracardiac focus of fetus on prenatal ultrasound; Chlamydia infection affecting pregnancy; Depression affecting pregnancy; and [redacted] weeks gestation of pregnancy on their problem list.  Patient reports no complaints.  Contractions: Not present. Vag. Bleeding: None.  Movement: Present. Denies leaking of fluid.   The following portions of the patient's history were reviewed and updated as appropriate: allergies, current medications, past family history, past medical history, past social history, past surgical history and problem list.   Objective:   Vitals:   05/09/20 0858  BP: 120/76  Pulse: 96  Weight: 151 lb (68.5 kg)    Fetal Status: Fetal Heart Rate (bpm): 140 Fundal Height: 37 cm Movement: Present     General:  Alert, oriented and cooperative. Patient is in no acute distress.  Skin: Skin is warm and dry. No rash noted.   Cardiovascular: Normal heart rate noted  Respiratory: Normal respiratory effort, no problems with respiration noted  Abdomen: Soft, gravid, appropriate for gestational age.  Pain/Pressure: Absent     Pelvic: Cervical exam performed in the presence of a chaperone Dilation: Fingertip Effacement (%): 20 Station: -3  Extremities: Normal range of motion.     Mental Status: Normal mood and affect. Normal behavior. Normal judgment and thought content.   Assessment and Plan:  Pregnancy: G1P0 at [redacted]w[redacted]d 1. Supervision of normal first pregnancy, antepartum Anticipatory guidance given, follow up visit in 1 week for NST  2. [redacted] weeks gestation of pregnancy --Fundal height measured at 37 cm EFW 6 lbs by Leopolds today --Cervical exam performed per patient's request  FT/long/posterior --Pt has doula would like low intervention birth  3. Carpal tunnel syndrome during pregnancy -Use wrist braces at night, use ice/rest  Term labor symptoms and general obstetric precautions including but not limited to vaginal bleeding, contractions, leaking of fluid and fetal movement were reviewed in detail with the patient. Please refer to After Visit Summary for other counseling recommendations.   Return in about 1 week (around 05/16/2020).  Future Appointments  Date Time Provider Department Center  05/16/2020  9:15 AM Marvetta Gibbons, Brand Males, NP [facility] None    Sharen Counter, CNM

## 2020-05-16 ENCOUNTER — Inpatient Hospital Stay (EMERGENCY_DEPARTMENT_HOSPITAL)
Admission: AD | Admit: 2020-05-16 | Discharge: 2020-05-17 | Disposition: A | Discharge status: Home / Self Care | Payer: BC Managed Care – PPO | Source: Home / Self Care | Attending: Family Medicine | Admitting: Family Medicine

## 2020-05-16 ENCOUNTER — Ambulatory Visit (INDEPENDENT_AMBULATORY_CARE_PROVIDER_SITE_OTHER): Payer: BC Managed Care – PPO | Admitting: Nurse Practitioner

## 2020-05-16 ENCOUNTER — Encounter (HOSPITAL_COMMUNITY): Payer: Self-pay | Admitting: Family Medicine

## 2020-05-16 ENCOUNTER — Other Ambulatory Visit: Payer: Self-pay | Admitting: Advanced Practice Midwife

## 2020-05-16 ENCOUNTER — Other Ambulatory Visit: Payer: Self-pay

## 2020-05-16 VITALS — BP 129/82 | HR 103 | Wt 151.2 lb

## 2020-05-16 DIAGNOSIS — Z3400 Encounter for supervision of normal first pregnancy, unspecified trimester: Secondary | ICD-10-CM

## 2020-05-16 DIAGNOSIS — F32A Depression, unspecified: Secondary | ICD-10-CM

## 2020-05-16 DIAGNOSIS — O480 Post-term pregnancy: Secondary | ICD-10-CM | POA: Insufficient documentation

## 2020-05-16 DIAGNOSIS — Z3A40 40 weeks gestation of pregnancy: Secondary | ICD-10-CM | POA: Diagnosis not present

## 2020-05-16 DIAGNOSIS — O283 Abnormal ultrasonic finding on antenatal screening of mother: Secondary | ICD-10-CM

## 2020-05-16 DIAGNOSIS — R87610 Atypical squamous cells of undetermined significance on cytologic smear of cervix (ASC-US): Secondary | ICD-10-CM

## 2020-05-16 DIAGNOSIS — O479 False labor, unspecified: Secondary | ICD-10-CM

## 2020-05-16 DIAGNOSIS — O99340 Other mental disorders complicating pregnancy, unspecified trimester: Secondary | ICD-10-CM

## 2020-05-16 DIAGNOSIS — O471 False labor at or after 37 completed weeks of gestation: Secondary | ICD-10-CM | POA: Insufficient documentation

## 2020-05-16 NOTE — Patient Instructions (Addendum)
AREA PEDIATRIC/FAMILY PRACTICE PHYSICIANS  [place] ([address]) . [facility] o Chambliss, MD; Eniola, MD; Hale, MD; Hensel, MD; McDiarmid, MD; McIntyer, MD; Neal, MD; Walden, MD o [address] [place], [place] [address] o [phone] o Mon-Fri 8:30-12:30, 1:30-5:00 o Providers come to see babies at [facility] o Accepting Medicaid . [facility] o Limited providers who accept newborns: Koirala, MD; Morrow, MD; Wolters, MD o [address], [place], [place] [address] o [phone] o Mon-Fri 8:00-5:30 o Babies seen by providers at [facility] o Does NOT accept Medicaid o Please call early in hospitalization for appointment (limited availability)  . [facility] o Mulberry, MD o [address] [place], [place] [address] o [phone] o Mon, Tue, Thur, Fri 8:30-5:00, Wed 10:00-7:00 (closed 1-2pm) o Babies seen by [facility] providers o Accepting Medicaid . [facility] - Pediatrician o Rubin, MD o [address], [place], [place] [address] o [phone] o Mon-Fri 8:30-5:00, Sat 8:30-12:00 o Provider comes to see babies at [facility] o Accepting Medicaid o Must have been referred from current patients or contacted office prior to delivery . [facility] ([facility]) o Brown, MD; Chandler, MD; Ettefagh, MD; Grant, MD; Lester, MD; McCormick, MD; McQueen, MD; Prose, MD; Simha, MD; Stanley, MD; Stryffeler, NP; Tebben, NP o [address], [place], [place] [address] o [phone] o Mon, Tue, Thur, Fri 8:30-5:30, Wed 9:30-5:30, Sat 8:30-12:30 o Babies seen by [facility] providers o Accepting Medicaid o Only accepting infants of first-time parents or siblings of current patients o Hospital discharge coordinator will make follow-up appointment . Jack Amos o [address],  [place], [place]  [address] o [phone]   Fax - [phone] . [facility] o [address], [place], [place]  [address] o Phone - [phone]   Fax - [phone] . Shilpa Gosrani o [address], [place], [place]  [address] o [phone]  [place] ([address]) . [facility] o Bates, MD; [place], MD; Cooper, Cox, MD; MD; Davis, MD; Dovico, MD; Ettefaugh, MD; Troia, MD; Lowe, MD; Keiffer, MD; Melvin, MD; Sumner, MD; Williams, MD o [address], [place], [place] [address] o [phone] o Mon-Fri 8:30-5:00 (extended evenings Mon-Thur as needed), Sat-Sun 10:00-1:00 o Providers come to see babies at [facility] o Accepting Medicaid for families of first-time babies and families with all children in the household age 3 and under. Must register with office prior to making appointment (M-F only). . [facility] o Henson, NP; Knapp, MD; Lalonde, MD; Tysinger, PA o [address] [place], [place] [address] o [phone] o Mon-Fri 8:00-5:00 o Babies seen by providers at [facility] o Does NOT accept Medicaid/Commercial Insurance Only . [facility] ([facility])  o Artis, MD; Barnes, MD; Bratton, MD; Coccaro, MD; Lockett Gardner, MD; Kramer, MD; Marshall, MD; Netherton, MD; Poleto, MD; Skinner, MD o [address] [place], [place] [address] o [phone] o Mon-Fri 8:30-5:30, Sat (Oct.-Mar.) 9:00-1:00 o Babies seen by providers at [facility] o Accepting Medicaid  [place] ([address]) . [facility] o Reid, MD; Warner, MD o [address], [place],  [address] o [phone] o Mon-Fri 8:30-5:00, Sat 8:30-12:00 o Providers come to see babies at [facility] o Does NOT accept Medicaid . [facility] o Becker, PA; Hagler, MD; Scifres, PA; Sun, MD; Swayne, MD o [address],  [place], [place] [address] o [phone] o Mon-Fri 8:00-5:00 o Babies seen by providers at [facility] o Does NOT accept Medicaid o Only accepting babies of parents who are patients o Please call early in hospitalization for appointment (limited availability) . [facility] o Clark, MD; Frye, MD; Kelleher, MD; Mack, NP; Miller, MD; O'Keller, MD; Patterson, NP; Pudlo, MD; Puzio, MD; Thomas, MD; Tucker, MD; Twiselton, MD o [address], [place], [place] [address] o [phone] o Mon-Fri 8:00-5:00, Sat 9:00-12:00 o Providers come to see babies at [facility] o Does NOT accept Medicaid  [place] ([address]) . [facility] o Limited providers accepting new patients: Brake, NP; Wharton, PA o [address], [place], [place] [address] o [phone] o Mon-Fri 8:00-5:00 o Babies seen by providers at [facility] o Does NOT accept Medicaid o Only accepting babies of parents who are patients o Please call early in hospitalization for appointment (limited availability) . [facility] o Gay, MD; Quinlan, MD o [address] [place], [place] [address] o [phone] (press 1 to schedule appointment) o Mon-Fri 8:00-5:00 o Providers come to see babies at [facility] o Does NOT accept Medicaid . [facility] o Mazer, MD o [address] [place], [place] [address] o [phone] o Mon-Fri 8:30-5:00 (lunch 12:30-1:00), extended hours by appointment only Wed 5:00-6:30 o Babies seen by [facility] providers o Accepting Medicaid . [facility] o Banks, MD; Jordan, MD; Koberlein, MD o [address], [place], [place] [address] o [phone] o Mon-Fri 8:00-5:00 o Babies seen by [facility] providers o Does NOT accept Medicaid . [facility] o Parker, MD; Hunter, MD; Wallace, DO o [address] [place], [place]  [address] o [phone] o Mon-Fri 8:00-5:00 o Babies seen by [facility] providers o Does NOT accept Medicaid . [facility] o Brandon, PA; Brecken, PA; Christy, NP; Dees, MD; DeClaire, MD; DeWeese, MD; Hansen, NP; Mills, NP; Parrish, NP; Smoot, NP; Summer, MD; Vapne, MD o [address] [place], [place] [address] o [phone] o Mon-Fri 8:30-5:00, Sat 10:00-1:00 o Providers come to see babies at [facility] o Does NOT accept Medicaid o Free prenatal information session Tuesdays at 4:45pm . [facility] o Bouska, MD; Gordon, PA; Jeffery, PA; Weber, PA o [address] [place] [address] o [phone] o Mon-Fri 7:30-5:30 o Babies seen by [facility] providers . [facility] o [address], [place], [place], [place]  [address] o [phone]   Fax - [phone]  [place] ([phone]) . [facility] o [place]ese, [place] o [address] [place], [place] [address] o [phone] o Mon-Thur 8:00-6:00 o Providers come to see babies at [facility] o Accepting Medicaid . [facility] o Anderson, NP; Badger, MD; Beal, PA; Spencer, PA o [address] [place],  [address] o [phone] o Mon-Thur 7:30-7:30, Fri 7:30-4:30 o Babies seen by [facility] providers o Accepting Medicaid . [facility] o Agbuya, MD; Klett, NP; Romgoolam, MD o [address], ,  [address] o [phone] o Mon-Fri 8:30-5:00, Sat 8:30-12:00 o Providers come to see babies at [facility] o Accepting Medicaid o Must have "Meet & Greet" appointment at office prior to delivery . [facility]  ([facility] ) o McCord,   MD; Juleen China, MD; Clydene Laming, [address], [place], [place] [address] o [phone] o Mon-Wed 8:00-6:00, Thur-Fri 8:00-5:00, Sat 9:00-12:00 o Providers come to  see babies at [facility] o Does NOT accept Medicaid o Only accepting siblings of current patients . [facility] [place], [place], [place], [place]  [address] o [phone]   Fax [phone] . [facility] [place] [address], [place], [place]  [address] o [phone]   Fax - Morton Gorman [phone] & [phone]) . Therapist, music at [place], DO; [place], [address] [place], [place] [address] o [phone] o Mon-Fri 7:00-5:00 o Babies seen by [facility] providers o Does NOT accept Medicaid . [place], MD; [place], [place]; [place], [place], [place], [place] [address] o [phone] o Mon-Fri 8:00-5:00 o Babies seen by [facility] providers o Accepting Medicaid . Lamont, MD; [place], [place]; [place], NP; [place], [place], [place], [place] [address] o [phone] o Mon-Fri 8:00-5:00 o Babies seen by providers at [facility] [place] [phone]) . [facility], [place] o [address] [place], [place] [address] o [phone] o Mon-Fri 8:00-5:00 o Babies seen by [facility] providers o Does NOT accept Medicaid o Limited availability, please call early in hospitalization to schedule follow-up . [facility] Leilani Merl, PA; Maisie Fus, MD; [place], MD; [place], [place]; Jeannine Kitten, MD; [place], [place] [facility] [address], [place], [place] [address] o [phone] o Mon-Fri 8:30-5:00, Sat 9:00-12:00 o Babies seen by providers at [facility] o Accepting Medicaid o Please register online then schedule online or call office o [email or web address] . [place] ([facility]  [place]) Kristian Covey, NP; Dwyane Dee, MD; Leonidas Romberg, PA o [address] [place], [place], [place] [address] o [phone] o Mon-Fri 8:00-5:00 o Babies seen by providers at [facility] o Accepting Medicaid . [place] ([facility]) Dairl Ponder, MD; Rayvon Char, NP; Melina Modena, MD o [address] [place], [place], [place] [address] o [phone] o Mon-Fri 8:00-5:30, Sat&Sun by appointment (phones open at 8:30) o Babies seen by [facility] providers o Accepting Medicaid o Must be a first-time baby or sibling of current patient . [place], [address], [place], [place]  [address] o [phone]   Fax - [phone]  [place] [phone] & [phone]) . [place], [place]; [place], [place]; Benjamine Mola, MD; [place], [place]; Harrell Lark, MD o [address] [place], [place] [address] o [phone] o Mon-Thur 8:00-7:00, Fri 8:00-5:00, Sat 8:00-12:00, Sun 9:00-12:00 o Babies seen by [facility] providers o Accepting Medicaid . [facility], MD; Ruthann Cancer, MD; [facility], MD o [address], [place], [place] [address] o [phone] o Mon-Thur 8:00-5:00 o Babies seen by providers at [facility] o Accepting Medicaid . [facility] [place], MD; Coe[place], MD; Amedeo Plenty, MD; Bobby Rumpf, MD; List, MD; Lavonia Drafts, MD; Ruthann Cancer, MD; Selinda Eon, MD; Audie Box, MD; Jim Like, MD; Christie Nottingham, MD; Hubbard Hartshorn, MD; Modena Nunnery, MD o [address] [place], [place]  [address] o [phone] o Mon-Fri 8:00-5:30, Sat (Oct.-Mar.) 9:00-1:00 o Babies seen by providers at [facility] o Accepting Medicaid o Must fill out new patient packet, available online at [email or web address] . [place] ([facility]) Barnabas Lister, NP; Kenton Kingfisher, NP; Claiborne Billings, NP; Rolla Plate, MD;  [place], [place]; Carola Rhine, MD; Tyron Russell, MD; Delia Chimes, NP o [address], [place], [place] [address] o [phone] o Mon-Thur 8:00-5:30, Fri 8:00-5:00 o Babies seen by providers at [place] [phone]) . [place], [place]; [place], MD; Dennard Schaumann, MD; [place], [place] o [address] [place], [place] [address] o [phone] o Mon-Fri 8:00-5:00 o Babies seen by providers at [place] [phone]) . [place] [facility] [place], [place]; Olen Pel, MD; [place], [place], [place], [place] [address] o [phone] o Mon-Fri 8:00-5:00 o Babies seen by providers at [facility] o Does NOT accept Medicaid o Limited appointment availability, please call early in hospitalization  . Therapist, music at [place], [place]; [place], [address], [place], [place] [address] o [phone] o Mon-Fri 8:00-5:00 o Babies seen by [facility] providers o Does NOT accept Medicaid . [facility] Su Grand, MD; Guy Sandifer, MD; [place], [place]; [place], [place] [address], [place], [place] [address] o [phone] o Mon-Fri 8:00-5:00 o After hours clinic [facility] [place], [place] [address]) [phone] Mon-Fri 5:00-8:00, Sat 12:00-6:00, Sun 10:00-4:00 o Babies seen by [facility] providers o Accepting Medicaid . [place] [facility] o [address], [place], [place]  [address] o [phone]   Fax - [phone]  [place] [phone]) . Therapist, music at [facility], MD o [address] [place], [place], [place] [address] o [phone] o Mon-Fri 8:00-5:00 o Babies seen by [facility] providers o Does NOT accept Medicaid . [place] ([facility]) Bing Neighbors, MD o [address] [place], [place],   [address] o [phone] o Mon-Thur 8:00-7:00, Fri 8:00-5:00, Sat 8:00-12:00 o Babies seen by providers at [facility] o Accepting Medicaid - but does not have vaccinations in office (must be received elsewhere) o Limited availability, please call early in hospitalization  [place] ([address]) . [place], MD o [address] [place] [address] o [phone]  Fax [phone]    Signs and Symptoms of Labor Labor is your body's natural process of moving your baby, placenta, and umbilical cord out of your uterus. The process of labor usually starts when your baby is full-term, between 52 and 40 weeks of pregnancy. How will I know when I am close to going into labor? As your body prepares for labor and the birth of your baby, you may notice the following symptoms in the weeks and days before true labor starts:  Having a strong desire to get your home ready to receive your new baby. This is called nesting. Nesting may be a sign that labor is approaching, and it may occur several weeks before birth. Nesting may involve cleaning and organizing your home.  Passing a small amount of thick, bloody mucus out of your vagina (normal bloody show or losing your mucus plug). This may happen more than a week before labor begins, or it might occur right  before labor begins as the opening of the cervix starts to widen (dilate). For some women, the entire mucus plug passes at once. For others, smaller portions of the mucus plug may gradually pass over several days.  Your baby moving (dropping) lower in your pelvis to get into position for birth (lightening). When this happens, you may feel more pressure on your bladder and pelvic bone and less pressure on your ribs. This may make it easier to breathe. It may also cause you to need to urinate more often and have problems with bowel movements.  Having "practice contractions" (Braxton Hicks contractions) that occur at  irregular (unevenly spaced) intervals that are more than 10 minutes apart. This is also called false labor. False labor contractions are common after exercise or sexual activity, and they will stop if you change position, rest, or drink fluids. These contractions are usually mild and do not get stronger over time. They may feel like: ? A backache or back pain. ? Mild cramps, similar to menstrual cramps. ? Tightening or pressure in your abdomen. Other early symptoms that labor may be starting soon include:  Nausea or loss of appetite.  Diarrhea.  Having a sudden burst of energy, or feeling very tired.  Mood changes.  Having trouble sleeping. How will I know when labor has begun? Signs that true labor has begun may include:  Having contractions that come at regular (evenly spaced) intervals and increase in intensity. This may feel like more intense tightening or pressure in your abdomen that moves to your back. ? Contractions may also feel like rhythmic pain in your upper thighs or back that comes and goes at regular intervals. ? For first-time mothers, this change in intensity of contractions often occurs at a more gradual pace. ? Women who have given birth before may notice a more rapid progression of contraction changes.  Having a feeling of pressure in the vaginal area.  Your water breaking (rupture of membranes). This is when the sac of fluid that surrounds your baby breaks. When this happens, you will notice fluid leaking from your vagina. This may be clear or blood-tinged. Labor usually starts within 24 hours of your water breaking, but it may take longer to begin. ? Some women notice this as a gush of fluid. ? Others notice that their underwear repeatedly becomes damp. Follow these instructions at home:   When labor starts, or if your water breaks, call your health care provider or nurse care line. Based on your situation, they will determine when you should go in for an  exam.  When you are in early labor, you may be able to rest and manage symptoms at home. Some strategies to try at home include: ? Breathing and relaxation techniques. ? Taking a warm bath or shower. ? Listening to music. ? Using a heating pad on the lower back for pain. If you are directed to use heat:  Place a towel between your skin and the heat source.  Leave the heat on for 20-30 minutes.  Remove the heat if your skin turns bright red. This is especially important if you are unable to feel pain, heat, or cold. You may have a greater risk of getting burned. Get help right away if:  You have painful, regular contractions that are 5 minutes apart or less.  Labor starts before you are [redacted] weeks along in your pregnancy.  You have a fever.  You have a headache that does not go away.  You  have bright red blood coming from your vagina.  You do not feel your baby moving.  You have a sudden onset of: ? Severe headache with vision problems. ? Nausea, vomiting, or diarrhea. ? Chest pain or shortness of breath. These symptoms may be an emergency. If your health care provider recommends that you go to the hospital or birth center where you plan to deliver, do not drive yourself. Have someone else drive you, or call emergency services (911 in the [place] Summary  Labor is your body's natural process of moving your baby, placenta, and umbilical cord out of your uterus.  The process of labor usually starts when your baby is full-term, between 23 and 40 weeks of pregnancy.  When labor starts, or if your water breaks, call your health care provider or nurse care line. Based on your situation, they will determine when you should go in for an exam. This information is not intended to replace advice given to you by your health care provider. Make sure you discuss any questions you have with your health care provider. Document Revised: 03/23/2017 Document Reviewed: 11/28/2016 Elsevier Patient  Education  2020 [employer].

## 2020-05-16 NOTE — Progress Notes (Signed)
° ° °  Subjective:  Diana Woodard is a 24 y.o. G1P0 at [redacted]w[redacted]d being seen today for ongoing prenatal care.  She is currently monitored for the following issues for this low-risk pregnancy and has Supervision of normal pregnancy, antepartum; ASCUS with positive high risk HPV cervical; Echogenic intracardiac focus of fetus on prenatal ultrasound; Chlamydia infection affecting pregnancy; Depression affecting pregnancy; and [redacted] weeks gestation of pregnancy on their problem list.  Patient reports contractions about 20 minutes apart.  Contractions: Regular. Vag. Bleeding: None.  Movement: Present. Denies leaking of fluid.   The following portions of the patient's history were reviewed and updated as appropriate: allergies, current medications, past family history, past medical history, past social history, past surgical history and problem list. Problem list updated.  Objective:   Vitals:   05/16/20 0930  BP: 129/82  Pulse: (!) 103  Weight: 151 lb 3.2 oz (68.6 kg)    Fetal Status: Fetal Heart Rate (bpm): 155 Fundal Height: 36 cm Movement: Present  Presentation: Vertex  General:  Alert, oriented and cooperative. Patient is in no acute distress.  Skin: Skin is warm and dry. No rash noted.   Cardiovascular: Normal heart rate noted  Respiratory: Normal respiratory effort, no problems with respiration noted  Abdomen: Soft, gravid, appropriate for gestational age. Pain/Pressure: Present     Pelvic:  Cervical exam performed Dilation: 1 Effacement (%): 70 Station: -3  Extremities: Normal range of motion.  Edema: None  Mental Status: Normal mood and affect. Normal behavior. Normal judgment and thought content.   Urinalysis:      Assessment and Plan:  Pregnancy: G1P0 at [redacted]w[redacted]d  1. Supervision of normal first pregnancy, antepartum Advised to select Pediatrician and call today Will see again in office before induction for NST as BPP unable to be scheduled due to no [place] appointments until 05-28-20. Will  schedule for induction at 41w 0d  2. Post term pregnancy over 40 weeks Category 1 tracing - FHT baseline 125, no decelerations (at one point on strip client was laughing and fluctuations noted), uterine irritability, 15x15 accels noted.  - [place] MFM FETAL BPP WO NON STRESS; Future  3. [redacted] weeks gestation of pregnancy Has made cervical change since last visit - hopes to be in labor before induction  Term labor symptoms and general obstetric precautions including but not limited to vaginal bleeding, contractions, leaking of fluid and fetal movement were reviewed in detail with the patient. Please refer to After Visit Summary for other counseling recommendations.  Return in about 5 days (around 05/21/2020) for ROB .  Nolene Bernheim, RN, MSN, NP-BC Nurse Practitioner, [facility] for [employer], [facility] 05/16/2020 9:54 AM

## 2020-05-16 NOTE — MAU Note (Signed)
Ctxs since last night. Had appt this am and was one cm. Last hour ctxs.closer. Some bloody show.

## 2020-05-16 NOTE — Progress Notes (Signed)
ROB  CC: swelling in feet, slight tingling in right arm. Thinks she may be having contractions. Wants to see if dilation has started  NST Today.

## 2020-05-16 NOTE — Discharge Instructions (Signed)
Braxton Hicks Contractions Contractions of the uterus can occur throughout pregnancy, but they are not always a sign that you are in labor. You may have practice contractions called Braxton Hicks contractions. These false labor contractions are sometimes confused with true labor. What are Braxton Hicks contractions? Braxton Hicks contractions are tightening movements that occur in the muscles of the uterus before labor. Unlike true labor contractions, these contractions do not result in opening (dilation) and thinning of the cervix. Toward the end of pregnancy (32-34 weeks), Braxton Hicks contractions can happen more often and may become stronger. These contractions are sometimes difficult to tell apart from true labor because they can be very uncomfortable. You should not feel embarrassed if you go to the hospital with false labor. Sometimes, the only way to tell if you are in true labor is for your health care provider to look for changes in the cervix. The health care provider will do a physical exam and may monitor your contractions. If you are not in true labor, the exam should show that your cervix is not dilating and your water has not broken. If there are no other health problems associated with your pregnancy, it is completely safe for you to be sent home with false labor. You may continue to have Braxton Hicks contractions until you go into true labor. How to tell the difference between true labor and false labor True labor  Contractions last 30-70 seconds.  Contractions become very regular.  Discomfort is usually felt in the top of the uterus, and it spreads to the lower abdomen and low back.  Contractions do not go away with walking.  Contractions usually become more intense and increase in frequency.  The cervix dilates and gets thinner. False labor  Contractions are usually shorter and not as strong as true labor contractions.  Contractions are usually irregular.  Contractions  are often felt in the front of the lower abdomen and in the groin.  Contractions may go away when you walk around or change positions while lying down.  Contractions get weaker and are shorter-lasting as time goes on.  The cervix usually does not dilate or become thin. Follow these instructions at home:   Take over-the-counter and prescription medicines only as told by your health care provider.  Keep up with your usual exercises and follow other instructions from your health care provider.  Eat and drink lightly if you think you are going into labor.  If Braxton Hicks contractions are making you uncomfortable: ? Change your position from lying down or resting to walking, or change from walking to resting. ? Sit and rest in a tub of warm water. ? Drink enough fluid to keep your urine pale yellow. Dehydration may cause these contractions. ? Do slow and deep breathing several times an hour.  Keep all follow-up prenatal visits as told by your health care provider. This is important. Contact a health care provider if:  You have a fever.  You have continuous pain in your abdomen. Get help right away if:  Your contractions become stronger, more regular, and closer together.  You have fluid leaking or gushing from your vagina.  You pass blood-tinged mucus (bloody show).  You have bleeding from your vagina.  You have low back pain that you never had before.  You feel your baby's head pushing down and causing pelvic pressure.  Your baby is not moving inside you as much as it used to. Summary  Contractions that occur before labor are   called Braxton Hicks contractions, false labor, or practice contractions.  Braxton Hicks contractions are usually shorter, weaker, farther apart, and less regular than true labor contractions. True labor contractions usually become progressively stronger and regular, and they become more frequent.  Manage discomfort from Braxton Hicks contractions  by changing position, resting in a warm bath, drinking plenty of water, or practicing deep breathing. This information is not intended to replace advice given to you by your health care provider. Make sure you discuss any questions you have with your health care provider. Document Revised: 06/05/2017 Document Reviewed: 11/06/2016 Elsevier Patient Education  2020 Elsevier Inc. Fetal Movement Counts Patient Name: ________________________________________________ Patient Due Date: ____________________ What is a fetal movement count?  A fetal movement count is the number of times that you feel your baby move during a certain amount of time. This may also be called a fetal kick count. A fetal movement count is recommended for every pregnant woman. You may be asked to start counting fetal movements as early as week 28 of your pregnancy. Pay attention to when your baby is most active. You may notice your baby's sleep and wake cycles. You may also notice things that make your baby move more. You should do a fetal movement count:  When your baby is normally most active.  At the same time each day. A good time to count movements is while you are resting, after having something to eat and drink. How do I count fetal movements? 1. Find a quiet, comfortable area. Sit, or lie down on your side. 2. Write down the date, the start time and stop time, and the number of movements that you felt between those two times. Take this information with you to your health care visits. 3. Write down your start time when you feel the first movement. 4. Count kicks, flutters, swishes, rolls, and jabs. You should feel at least 10 movements. 5. You may stop counting after you have felt 10 movements, or if you have been counting for 2 hours. Write down the stop time. 6. If you do not feel 10 movements in 2 hours, contact your health care provider for further instructions. Your health care provider may want to do additional tests  to assess your baby's well-being. Contact a health care provider if:  You feel fewer than 10 movements in 2 hours.  Your baby is not moving like he or she usually does. Date: ____________ Start time: ____________ Stop time: ____________ Movements: ____________ Date: ____________ Start time: ____________ Stop time: ____________ Movements: ____________ Date: ____________ Start time: ____________ Stop time: ____________ Movements: ____________ Date: ____________ Start time: ____________ Stop time: ____________ Movements: ____________ Date: ____________ Start time: ____________ Stop time: ____________ Movements: ____________ Date: ____________ Start time: ____________ Stop time: ____________ Movements: ____________ Date: ____________ Start time: ____________ Stop time: ____________ Movements: ____________ Date: ____________ Start time: ____________ Stop time: ____________ Movements: ____________ Date: ____________ Start time: ____________ Stop time: ____________ Movements: ____________ This information is not intended to replace advice given to you by your health care provider. Make sure you discuss any questions you have with your health care provider. Document Revised: 02/10/2019 Document Reviewed: 02/10/2019 Elsevier Patient Education  2020 Elsevier Inc.  

## 2020-05-16 NOTE — Progress Notes (Addendum)
S: Ms. Diana Woodard is a 24 y.o. G1P0 at [redacted]w[redacted]d  who presents to MAU today for labor evaluation.     Cervical exam by RN:  Dilation: 3.5 Effacement (%): 90 Cervical Position: Middle Station: -2 Presentation: Vertex Exam by:: Holly Flippin RN   Fetal Monitoring: Baseline: 120 bpm Variability: Moderate  Accelerations: 15 x 15  Decelerations: No decels Contractions: 4 mins  MDM Discussed patient with RN. NST reviewed.   A: SIUP at [redacted]w[redacted]d  False labor  P: Discharge home Labor precautions and kick counts included in AVS Patient to follow-up with [facility] as scheduled  Patient may return to MAU as needed or when in labor   Diana Jumbo, DO 05/16/2020 11:07 PM  GME ATTESTATION:  I saw and evaluated the patient. I agree with the findings and the plan of care as documented in the resident's note.  Alric Seton, MD OB Fellow, Faculty [facility], Center for [facility] 05/16/2020 11:22 PM

## 2020-05-17 ENCOUNTER — Inpatient Hospital Stay (HOSPITAL_COMMUNITY): Payer: BC Managed Care – PPO | Admitting: Anesthesiology

## 2020-05-17 ENCOUNTER — Encounter (HOSPITAL_COMMUNITY): Payer: Self-pay | Admitting: Family Medicine

## 2020-05-17 ENCOUNTER — Inpatient Hospital Stay (HOSPITAL_COMMUNITY)
Admission: AD | Admit: 2020-05-17 | Discharge: 2020-05-19 | DRG: 807 | Disposition: A | Discharge status: Home / Self Care | Payer: BC Managed Care – PPO | Attending: Family Medicine | Admitting: Family Medicine

## 2020-05-17 DIAGNOSIS — A749 Chlamydial infection, unspecified: Secondary | ICD-10-CM | POA: Diagnosis present

## 2020-05-17 DIAGNOSIS — R87810 Cervical high risk human papillomavirus (HPV) DNA test positive: Secondary | ICD-10-CM | POA: Diagnosis present

## 2020-05-17 DIAGNOSIS — R87610 Atypical squamous cells of undetermined significance on cytologic smear of cervix (ASC-US): Secondary | ICD-10-CM | POA: Diagnosis present

## 2020-05-17 DIAGNOSIS — O98819 Other maternal infectious and parasitic diseases complicating pregnancy, unspecified trimester: Secondary | ICD-10-CM | POA: Diagnosis present

## 2020-05-17 DIAGNOSIS — F32A Depression, unspecified: Secondary | ICD-10-CM | POA: Diagnosis present

## 2020-05-17 DIAGNOSIS — Z3A40 40 weeks gestation of pregnancy: Secondary | ICD-10-CM

## 2020-05-17 DIAGNOSIS — Z20822 Contact with and (suspected) exposure to covid-19: Secondary | ICD-10-CM | POA: Diagnosis present

## 2020-05-17 DIAGNOSIS — O99340 Other mental disorders complicating pregnancy, unspecified trimester: Secondary | ICD-10-CM | POA: Diagnosis present

## 2020-05-17 DIAGNOSIS — O283 Abnormal ultrasonic finding on antenatal screening of mother: Secondary | ICD-10-CM | POA: Diagnosis present

## 2020-05-17 DIAGNOSIS — O26893 Other specified pregnancy related conditions, third trimester: Secondary | ICD-10-CM | POA: Diagnosis present

## 2020-05-17 DIAGNOSIS — Z3490 Encounter for supervision of normal pregnancy, unspecified, unspecified trimester: Secondary | ICD-10-CM

## 2020-05-17 LAB — RESPIRATORY PANEL BY RT PCR (FLU A&B, COVID)
Influenza A by PCR: NEGATIVE
Influenza B by PCR: NEGATIVE
SARS Coronavirus 2 by RT PCR: NEGATIVE

## 2020-05-17 LAB — CBC
HCT: 37.6 % (ref 36.0–46.0)
Hemoglobin: 12.5 g/dL (ref 12.0–15.0)
MCH: 28.8 pg (ref 26.0–34.0)
MCHC: 33.2 g/dL (ref 30.0–36.0)
MCV: 86.6 fL (ref 80.0–100.0)
Platelets: 218 10*3/uL (ref 150–400)
RBC: 4.34 MIL/uL (ref 3.87–5.11)
RDW: 13.1 % (ref 11.5–15.5)
WBC: 17.4 10*3/uL — ABNORMAL HIGH (ref 4.0–10.5)
nRBC: 0 % (ref 0.0–0.2)

## 2020-05-17 LAB — TYPE AND SCREEN
ABO/RH(D): A POS
Antibody Screen: NEGATIVE

## 2020-05-17 LAB — RPR: RPR Ser Ql: NONREACTIVE

## 2020-05-17 MED ORDER — TERBUTALINE SULFATE 1 MG/ML IJ SOLN: 0.2500 mg | Freq: Once | INTRAMUSCULAR | Status: DC | PRN

## 2020-05-17 MED ORDER — FENTANYL-BUPIVACAINE-NACL 0.5-0.125-0.9 MG/250ML-% EP SOLN
12.0000 mL/h | EPIDURAL | Status: DC | PRN
  Administered 2020-05-17: 12 mL/h via EPIDURAL
  Filled 2020-05-17: qty 250

## 2020-05-17 MED ORDER — FLEET ENEMA 7-19 GM/118ML RE ENEM: 1.0000 | ENEMA | RECTAL | Status: DC | PRN

## 2020-05-17 MED ORDER — ONDANSETRON HCL 4 MG/2ML IJ SOLN
4.0000 mg | Freq: Four times a day (QID) | INTRAMUSCULAR | Status: DC | PRN
  Filled 2020-05-17: qty 2

## 2020-05-17 MED ORDER — IBUPROFEN 600 MG PO TABS
600.0000 mg | ORAL_TABLET | Freq: Four times a day (QID) | ORAL | Status: DC
  Administered 2020-05-18 – 2020-05-19 (×7): 600 mg via ORAL
  Filled 2020-05-17 (×7): qty 1

## 2020-05-17 MED ORDER — FENTANYL-BUPIVACAINE-NACL 0.5-0.125-0.9 MG/250ML-% EP SOLN
EPIDURAL | Status: AC
  Filled 2020-05-17: qty 250

## 2020-05-17 MED ORDER — LACTATED RINGERS IV SOLN: 500.0000 mL | INTRAVENOUS | Status: DC | PRN

## 2020-05-17 MED ORDER — DIPHENHYDRAMINE HCL 25 MG PO CAPS: 25.0000 mg | ORAL_CAPSULE | Freq: Four times a day (QID) | ORAL | Status: DC | PRN

## 2020-05-17 MED ORDER — OXYTOCIN-SODIUM CHLORIDE 30-0.9 UT/500ML-% IV SOLN
1.0000 m[IU]/min | INTRAVENOUS | Status: DC
  Administered 2020-05-17: 2 m[IU]/min via INTRAVENOUS
  Filled 2020-05-17: qty 500

## 2020-05-17 MED ORDER — SENNOSIDES-DOCUSATE SODIUM 8.6-50 MG PO TABS
2.0000 | ORAL_TABLET | ORAL | Status: DC
  Administered 2020-05-18 (×2): 2 via ORAL
  Filled 2020-05-17 (×2): qty 2

## 2020-05-17 MED ORDER — PHENYLEPHRINE 40 MCG/ML (10ML) SYRINGE FOR IV PUSH (FOR BLOOD PRESSURE SUPPORT): 80.0000 ug | PREFILLED_SYRINGE | INTRAVENOUS | Status: DC | PRN

## 2020-05-17 MED ORDER — LIDOCAINE-EPINEPHRINE (PF) 2 %-1:200000 IJ SOLN
INTRAMUSCULAR | Status: DC | PRN
  Administered 2020-05-17: 5 mL via EPIDURAL

## 2020-05-17 MED ORDER — OXYCODONE-ACETAMINOPHEN 5-325 MG PO TABS: 2.0000 | ORAL_TABLET | ORAL | Status: DC | PRN

## 2020-05-17 MED ORDER — WITCH HAZEL-GLYCERIN EX PADS: 1.0000 "application " | MEDICATED_PAD | CUTANEOUS | Status: DC | PRN

## 2020-05-17 MED ORDER — SOD CITRATE-CITRIC ACID 500-334 MG/5ML PO SOLN: 30.0000 mL | ORAL | Status: DC | PRN

## 2020-05-17 MED ORDER — PRENATAL MULTIVITAMIN CH
1.0000 | ORAL_TABLET | Freq: Every day | ORAL | Status: DC
  Administered 2020-05-18 – 2020-05-19 (×2): 1 via ORAL
  Filled 2020-05-17 (×2): qty 1

## 2020-05-17 MED ORDER — PHENYLEPHRINE 40 MCG/ML (10ML) SYRINGE FOR IV PUSH (FOR BLOOD PRESSURE SUPPORT)
PREFILLED_SYRINGE | INTRAVENOUS | Status: AC
  Filled 2020-05-17: qty 10

## 2020-05-17 MED ORDER — TETANUS-DIPHTH-ACELL PERTUSSIS 5-2.5-18.5 LF-MCG/0.5 IM SUSY: 0.5000 mL | PREFILLED_SYRINGE | Freq: Once | INTRAMUSCULAR | Status: DC

## 2020-05-17 MED ORDER — EPHEDRINE 5 MG/ML INJ: 10.0000 mg | INTRAVENOUS | Status: DC | PRN

## 2020-05-17 MED ORDER — LACTATED RINGERS IV SOLN: INTRAVENOUS | Status: DC

## 2020-05-17 MED ORDER — MEASLES, MUMPS & RUBELLA VAC IJ SOLR: 0.5000 mL | Freq: Once | INTRAMUSCULAR | Status: DC

## 2020-05-17 MED ORDER — SIMETHICONE 80 MG PO CHEW: 80.0000 mg | CHEWABLE_TABLET | ORAL | Status: DC | PRN

## 2020-05-17 MED ORDER — DIBUCAINE (PERIANAL) 1 % EX OINT: 1.0000 "application " | TOPICAL_OINTMENT | CUTANEOUS | Status: DC | PRN

## 2020-05-17 MED ORDER — ONDANSETRON HCL 4 MG/2ML IJ SOLN: 4.0000 mg | INTRAMUSCULAR | Status: DC | PRN

## 2020-05-17 MED ORDER — OXYTOCIN-SODIUM CHLORIDE 30-0.9 UT/500ML-% IV SOLN: 1.0000 m[IU]/min | INTRAVENOUS | Status: DC

## 2020-05-17 MED ORDER — COCONUT OIL OIL: 1.0000 "application " | TOPICAL_OIL | Status: DC | PRN

## 2020-05-17 MED ORDER — ONDANSETRON HCL 4 MG PO TABS: 4.0000 mg | ORAL_TABLET | ORAL | Status: DC | PRN

## 2020-05-17 MED ORDER — BENZOCAINE-MENTHOL 20-0.5 % EX AERO
1.0000 "application " | INHALATION_SPRAY | CUTANEOUS | Status: DC | PRN
  Administered 2020-05-18: 1 via TOPICAL
  Filled 2020-05-17: qty 56

## 2020-05-17 MED ORDER — DIPHENHYDRAMINE HCL 50 MG/ML IJ SOLN: 12.5000 mg | INTRAMUSCULAR | Status: DC | PRN

## 2020-05-17 MED ORDER — OXYCODONE-ACETAMINOPHEN 5-325 MG PO TABS: 1.0000 | ORAL_TABLET | ORAL | Status: DC | PRN

## 2020-05-17 MED ORDER — OXYTOCIN-SODIUM CHLORIDE 30-0.9 UT/500ML-% IV SOLN: 2.5000 [IU]/h | INTRAVENOUS | Status: DC

## 2020-05-17 MED ORDER — LACTATED RINGERS IV SOLN
500.0000 mL | Freq: Once | INTRAVENOUS | Status: AC
  Administered 2020-05-17: 500 mL via INTRAVENOUS

## 2020-05-17 MED ORDER — OXYTOCIN BOLUS FROM INFUSION
333.0000 mL | Freq: Once | INTRAVENOUS | Status: AC
  Administered 2020-05-17: 333 mL via INTRAVENOUS

## 2020-05-17 MED ORDER — ACETAMINOPHEN 325 MG PO TABS: 650.0000 mg | ORAL_TABLET | ORAL | Status: DC | PRN

## 2020-05-17 MED ORDER — SODIUM CHLORIDE (PF) 0.9 % IJ SOLN
INTRAMUSCULAR | Status: DC | PRN
  Administered 2020-05-17: 12 mL/h via EPIDURAL

## 2020-05-17 MED ORDER — LIDOCAINE HCL (PF) 1 % IJ SOLN
30.0000 mL | INTRAMUSCULAR | Status: AC | PRN
  Administered 2020-05-17: 30 mL via SUBCUTANEOUS
  Filled 2020-05-17: qty 30

## 2020-05-17 NOTE — H&P (Signed)
OBSTETRIC ADMISSION HISTORY AND PHYSICAL  Adiana Lecuyer is a 24 y.o. female G1P0 with IUP at [redacted]w[redacted]d by LMP presenting for latent labor. She presented yesterday for contractions and was discharged after not making cervical change in MAU. She has currently changed from 3.5cm-->5cm and had a prolonged 3-4 minute fetal heart rate deceleration to the 60s in MAU.  She reports +FMs, No LOF, no VB, no blurry vision, headaches or peripheral edema, and RUQ pain.  She plans on breast feeding. She request nexplanon for birth control. She received her prenatal care at [facility]   Dating: By LMP --->  Estimated Date of Delivery: 05/15/20  Sono:    12/22/19@[redacted]w[redacted]d , CWD, normal anatomy except EIF, 260g, 22% EFW   Prenatal History/Complications:  Abnormal pap (needs pp colpo) EIF (normal variant, declined amnio) Chlamydia in preg (neg TOC) Depression (counseling, no meds)  Past Medical History: Past Medical History:  Diagnosis Date   Anxiety    Depression    Headache     Past Surgical History: Past Surgical History:  Procedure Laterality Date   NO PAST SURGERIES      Obstetrical History: OB History    Gravida  1   Para  0   Term      Preterm      AB      Living        SAB      TAB      Ectopic      Multiple      Live Births              Social History Social History   Socioeconomic History   Marital status: Single    Spouse name: Not on file   Number of children: Not on file   Years of education: Not on file   Highest education level: Not on file  Occupational History   Not on file  Tobacco Use   Smoking status: Never Smoker   Smokeless tobacco: Never Used  Vaping Use   Vaping Use: Never used  Substance and Sexual Activity   Alcohol use: Not Currently   Drug use: Never   Sexual activity: Yes  Other Topics Concern   Not on file  Social History Narrative   Not on file   Social Determinants of Health   Financial Resource Strain:     Difficulty of Paying Living Expenses: Not on file  Food Insecurity:    Worried About Running Out of Food in the Last Year: Not on file   Ran Out of Food in the Last Year: Not on file  Transportation Needs:    Lack of Transportation (Medical): Not on file   Lack of Transportation (Non-Medical): Not on file  Physical Activity:    Days of Exercise per Week: Not on file   Minutes of Exercise per Session: Not on file  Stress:    Feeling of Stress : Not on file  Social Connections:    Frequency of Communication with Friends and Family: Not on file   Frequency of Social Gatherings with Friends and Family: Not on file   Attends Religious Services: Not on file   Active Member of Clubs or Organizations: Not on file   Attends Banker Meetings: Not on file   Marital Status: Not on file    Family History: Family History  Problem Relation Age of Onset   Asthma Mother    Depression Mother    Miscarriages / Stillbirths Maternal Aunt  Hypertension Maternal Grandmother    Diabetes Maternal Grandfather     Allergies: No Known Allergies  Medications Prior to Admission  Medication Sig Dispense Refill Last Dose   acetaminophen (TYLENOL) 325 MG tablet Take 650 mg by mouth every 6 (six) hours as needed. (Patient not taking: Reported on 05/16/2020)      Elastic Bandages & Supports (COMFORT FIT MATERNITY SUPP MED) MISC 1 Device by Does not apply route daily. (Patient not taking: Reported on 05/16/2020) 1 each 0    fexofenadine (ALLEGRA) 180 MG tablet Take 180 mg by mouth daily.  (Patient not taking: Reported on 04/25/2020)      ondansetron (ZOFRAN) 4 MG tablet Take 1 tablet (4 mg total) by mouth every 6 (six) hours. (Patient not taking: Reported on 04/25/2020) 12 tablet 0    Prenatal Vit-Fe Fumarate-FA (MULTIVITAMIN-PRENATAL) 27-0.8 MG TABS tablet Take 1 tablet by mouth daily at 12 noon.        Review of Systems   All systems reviewed and negative except as  stated in HPI  Last menstrual period 08/09/2019. General appearance: alert, cooperative and no distress Lungs: normal respiratory effort Heart: regular rate and rhythm Abdomen: soft, non-tender; gravid Pelvic: as noted below Extremities: Homans sign is negative, no sign of DVT Presentation: cephalic per RN Fetal monitoringBaseline: 120 bpm, Variability: Good {> 6 bpm), Accelerations: Reactive and Decelerations: prolonged 4 minute decel to 60s with return to baseline, reassuring since Uterine activity difficult to trace Dilation: 5 Effacement (%): 100 Station: -1 Exam by:: weston,rn   Prenatal labs: ABO, Rh: --/--/PENDING (11/11 0157) Antibody: PENDING (11/11 0157) Rubella: 2.35 (06/09 1528) RPR: Non Reactive (08/04 1023)  HBsAg: Negative (06/09 1528)  HIV: Non Reactive (08/04 1023)  GBS: Negative/-- (10/13 0130)  2 hr Glucola passed Genetic screening  normal Anatomy US EIF, otherwise normal  Prenatal Transfer Tool  Maternal Diabetes: No Genetic Screening: Normal Maternal Ultrasounds/Referrals: Isolated EIF (echogenic intracardiac focus) Fetal Ultrasounds or other Referrals:  None Maternal Substance Abuse:  No Significant Maternal Medications:  None Significant Maternal Lab Results: Group B Strep negative  Results for orders placed or performed during the hospital encounter of 05/17/20 (from the past 24 hour(s))  Type and screen   Collection Time: 05/17/20  1:57 AM  Result Value Ref Range   ABO/RH(D) PENDING    Antibody Screen PENDING    Sample Expiration      05/20/2020,2359 Performed at [facility], [address] [place], [place] [address]     Patient Active Problem List   Diagnosis Date Noted   [redacted] weeks gestation of pregnancy 05/17/2020   ASCUS with positive high risk HPV cervical 03/07/2020   Echogenic intracardiac focus of fetus on prenatal ultrasound 03/07/2020   Chlamydia infection affecting pregnancy 03/07/2020   Depression affecting  pregnancy 03/07/2020   Supervision of normal pregnancy, antepartum 12/14/2019    Assessment/Plan:  Alayzia Hsu is a 24 y.o. G1P0 at [redacted]w[redacted]d here for latent labor and is admitted for prolonged 3-4 fetal decel in MAU in the setting of gestational age.  #Labor: Continue expectant management for now. Consider pitocin. #Pain: PRN #FWB: Cat 2, reassuring overall #ID: GBS neg #MOF: breastfeeding #MOC: nexplanon outpt #Circ: yes #MDD: follows with counselor, not on meds. SW consult pp  Alric Seton, MD  05/17/2020, 2:08 AM

## 2020-05-17 NOTE — Progress Notes (Addendum)
Labor Progress Note Diana Woodard is a 24 y.o. G1P0 at [redacted]w[redacted]d presented for latent labor and prolonged decelerations in MAU S: Patient is progressing well and is supported by family and doula at bedside   O:  BP 130/74   Pulse 85   Temp 98.5 F (36.9 C) (Oral)   Resp 18   LMP 08/09/2019   SpO2 97%  EFM: 130 HR/Moderate variabilities/ +Acels - Decels(Describe Fetal Tracing)  ToCo: 6 minutes  CVE: Dilation: 7.5 Effacement (%): 90 Cervical Position: Anterior Station: -1 Presentation: Vertex Exam by:: Carmie Kanner   A&P: 24 y.o. G1P0 [redacted]w[redacted]d for latent labor  #Labor: progressing well overall. AROM for thin meconium.  #Pain: Epidural  #FWB: Cat 1 #GBS negative    Sul Jonni Sanger, Medical Student 11:50 AM  I saw and evaluated the patient. I agree with the findings and the plan of care as documented in the medical student's note.  Casper Harrison, MD [facility] Family Medicine Fellow, [facility] for [facility], [facility]

## 2020-05-17 NOTE — Progress Notes (Addendum)
Labor Progress Note Haniyyah Kho is a 24 y.o. G1P0 at [redacted]w[redacted]d presented for SOL S: Feeling stronger contractions  O:  BP 130/74   Pulse 85   Temp 98.5 F (36.9 C) (Oral)   Resp 18   LMP 08/09/2019   SpO2 97%  EFM: 120/mod/accels/ no decels  CVE: Dilation: (P) 7.5 Effacement (%): (P) 90 Cervical Position: Anterior Station: -1 Presentation: Vertex Exam by:: K Kirkman  A&P: 24 y.o. G1P0 [redacted]w[redacted]d  #Labor: s/p arom with mec stain 1140. Continue expectant management. #Pain: epidural #FWB: cat 1 #GBS negative  Jesusita Oka, MD 11:46 AM

## 2020-05-17 NOTE — MAU Note (Signed)
Pt reports complaining of an increase in ctx intensity and closer together. +FM. Denies vaginal bleeding besides bloody show or LOF.

## 2020-05-17 NOTE — Progress Notes (Addendum)
Labor Progress Note Diana Woodard is a 24 y.o. G1P0000 at [redacted]w[redacted]d presented for SOL  S: Resting comfortably in bed. Family at bedside.   O:  BP 134/74    Pulse 91    Temp 99.7 F (37.6 C) (Axillary)    Resp 18    LMP 08/09/2019    SpO2 97%  EFM: 130 HR Moderate variability/ + accels/ early decels ToCo: 2-3 minutes  CVE: Dilation: 9 Effacement (%): 100 Cervical Position: Anterior Station: Plus 1 Presentation: Vertex Exam by:: Dr. Arita Miss   A&P: 24 y.o. G1P0000 [redacted]w[redacted]d admitted  for SOL  #Labor: Progressing well. Has made steady changes. AROM at 1140. Unchanged at recheck and so after discussion with patient, IUPC placed pitocin started at 1440. Patient now anterior lip, will labor down and recheck in 1-2 hours. Anticipate SVD.   #Pain: epidural  #FWB: Cat 1 #GBS negative   Sul Jonni Sanger, Medical Student 6:17 PM  I saw and evaluated the patient with the medical student. I agree with the findings and the plan of care as documented in the medical student's note.  Casper Harrison, MD [facility] Family Medicine Fellow, [facility] for [facility], [facility]

## 2020-05-17 NOTE — Discharge Summary (Signed)
Postpartum Discharge Summary     Patient Name: Diana Woodard DOB: 1996-04-01 MRN: [id number]  Date of admission: 05/17/2020 Delivery date:05/17/2020  Delivering provider: Janet Berlin  Date of discharge: 05/19/2020  Admitting diagnosis: Post term pregnancy at [redacted] weeks gestation [O48.0, Z3A.41] Intrauterine pregnancy: [redacted]w[redacted]d     Secondary diagnosis:  Active Problems:   Supervision of normal pregnancy, antepartum   ASCUS with positive high risk HPV cervical   Echogenic intracardiac focus of fetus on prenatal ultrasound   Chlamydia infection affecting pregnancy   Depression affecting pregnancy   [redacted] weeks gestation of pregnancy  Additional problems:     Discharge diagnosis: Term Pregnancy Delivered                                              Post partum procedures:none Augmentation: AROM and Pitocin Complications: None  Hospital course: Onset of Labor With Vaginal Delivery      24 y.o. yo G1P0000 at [redacted]w[redacted]d was admitted in Latent Labor on 05/17/2020. Patient had an uncomplicated labor course as follows:  Membrane Rupture Time/Date: 11:39 AM ,05/17/2020   Delivery Method:Vaginal, Spontaneous  Episiotomy: None  Lacerations:  Labial  Patient had an uncomplicated postpartum course.  She is ambulating, tolerating a regular diet, passing flatus, and urinating well. Patient is discharged home in stable condition on 05/19/20.  Newborn Data: Birth date:05/17/2020  Birth time:7:54 PM  Gender:Female  Living status:Living  Apgars:8 ,9  Weight:2980 g   Magnesium Sulfate received: No BMZ received: No Rhophylac:N/A MMR:No T-DaP:Given prenatally Flu: Yes Transfusion:No  Physical exam  Vitals:   05/18/20 1426 05/18/20 2003 05/18/20 2055 05/19/20 0539  BP: (!) 105/58 127/74 118/77 114/83  Pulse: (!) 55 73 75 65  Resp: $Remo'17 16 16 15  'XTdUD$ Temp: 98.6 F (37 C) 98.4 F (36.9 C) 98 F (36.7 C) 98.4 F (36.9 C)  TempSrc: Oral Oral Oral Oral  SpO2: 100% 100% 100% 100%   General:  alert, cooperative and no distress Lochia: appropriate Uterine Fundus: firm Incision: Healing well with no significant drainage DVT Evaluation: No evidence of DVT seen on physical exam. Labs: Lab Results  Component Value Date   WBC 17.4 (H) 05/17/2020   HGB 12.5 05/17/2020   HCT 37.6 05/17/2020   MCV 86.6 05/17/2020   PLT 218 05/17/2020   CMP Latest Ref Rng & Units 11/03/2019  Glucose 70 - 99 mg/dL 86  BUN 6 - 20 mg/dL 14  Creatinine 0.44 - 1.00 mg/dL 0.68  Sodium 135 - 145 mmol/L 135  Potassium 3.5 - 5.1 mmol/L 3.9  Chloride 98 - 111 mmol/L 106  CO2 22 - 32 mmol/L 21(L)  Calcium 8.9 - 10.3 mg/dL 9.3  Total Protein 6.5 - 8.1 g/dL 7.7  Total Bilirubin 0.3 - 1.2 mg/dL 0.2(L)  Alkaline Phos 38 - 126 U/L 39  AST 15 - 41 U/L 23  ALT 0 - 44 U/L 14   Edinburgh Score: Edinburgh Postnatal Depression Scale Screening Tool 05/18/2020  I have been able to laugh and see the funny side of things. (No Data)     After visit meds:  Allergies as of 05/19/2020   No Known Allergies     Medication List    STOP taking these medications   fexofenadine 180 MG tablet Commonly known as: ALLEGRA   ondansetron 4 MG tablet Commonly known as: ZOFRAN  TAKE these medications   acetaminophen 325 MG tablet Commonly known as: Tylenol Take 2 tablets (650 mg total) by mouth every 4 (four) hours as needed (for pain scale < 4). What changed:   when to take this  reasons to take this   Solomon 1 Device by Does not apply route daily.   ibuprofen 600 MG tablet Commonly known as: ADVIL Take 1 tablet (600 mg total) by mouth every 6 (six) hours.   multivitamin-prenatal 27-0.8 MG Tabs tablet Take 1 tablet by mouth daily at 12 noon.        Discharge home in stable condition Infant Feeding: Breast Infant Disposition:home with mother Discharge instruction: per After Visit Summary and Postpartum booklet. Activity: Advance as tolerated. Pelvic rest for 6 weeks.   Diet: routine diet Future Appointments: Future Appointments  Date Time Provider Assumption  06/04/2020 10:00 AM Lynnea Ferrier, LCSW [facility] None  06/14/2020  2:00 PM Virginia Rochester, NP [place] None  06/26/2020  3:00 PM Cephas Darby, MD [place] None   Follow up Visit:   Please schedule this patient for a In person postpartum visit in 6 weeks with the following provider: Any provider. Additional Postpartum F/U:[place] at six week visit, behavioral health check in 1-2 weeks  Low risk pregnancy complicated by: depression not on meds Delivery mode:  Vaginal, Spontaneous  Anticipated Birth Control:  Nexplanon as outpatient    05/19/2020 Janet Berlin, MD

## 2020-05-17 NOTE — Progress Notes (Signed)
LABOR PROGRESS NOTE  Diana Woodard is a 24 y.o. G1P0 at [redacted]w[redacted]d  admitted for latent labor.   Subjective: Doing well. Resting comfortably.   Objective: BP 115/75   Pulse 91   Temp 98.7 F (37.1 C) (Oral)   Resp 17   LMP 08/09/2019   SpO2 97%  or  Vitals:   05/17/20 0547 05/17/20 0600 05/17/20 0630 05/17/20 0700  BP:  (!) 111/57 122/72 115/75  Pulse:  72 72 91  Resp:      Temp: 98.7 F (37.1 C)     TempSrc: Oral     SpO2:       Dilation: 7 Effacement (%): 100 Cervical Position: Anterior Station: -1 Presentation: Vertex Exam by:: K Kirkman FHT: baseline rate 120 bpm, moderate varibility, 15 x 15 acel, no decel Toco: 4 mins  Labs: Lab Results  Component Value Date   WBC 17.4 (H) 05/17/2020   HGB 12.5 05/17/2020   HCT 37.6 05/17/2020   MCV 86.6 05/17/2020   PLT 218 05/17/2020   Patient Active Problem List   Diagnosis Date Noted  . [redacted] weeks gestation of pregnancy 05/17/2020  . ASCUS with positive high risk HPV cervical 03/07/2020  . Echogenic intracardiac focus of fetus on prenatal ultrasound 03/07/2020  . Chlamydia infection affecting pregnancy 03/07/2020  . Depression affecting pregnancy 03/07/2020  . Supervision of normal pregnancy, antepartum 12/14/2019   Assessment / Plan: 24 y.o. G1P0 at [redacted]w[redacted]d here for latent labor.   Labor: Expectant, consider augmenting when appropriate.  Fetal Wellbeing: Cat I Pain Control:  Epidural Anticipated MOD: Vaginal  Kaius Daino Autry-Lott, DO 05/17/2020, 7:55 AM PGY-2, [facility]

## 2020-05-17 NOTE — Anesthesia Preprocedure Evaluation (Signed)
Anesthesia Evaluation  Patient identified by MRN, date of birth, ID band Patient awake    Reviewed: Allergy & Precautions, NPO status , Patient's Chart, lab work & pertinent test results  Airway Mallampati: II  TM Distance: >3 FB Neck ROM: Full    Dental no notable dental hx. (+) Teeth Intact, Dental Advisory Given   Pulmonary neg pulmonary ROS,    Pulmonary exam normal breath sounds clear to auscultation       Cardiovascular negative cardio ROS Normal cardiovascular exam Rhythm:Regular Rate:Normal     Neuro/Psych  Headaches, PSYCHIATRIC DISORDERS Anxiety Depression    GI/Hepatic negative GI ROS, Neg liver ROS,   Endo/Other  negative endocrine ROS  Renal/GU negative Renal ROS  negative genitourinary   Musculoskeletal negative musculoskeletal ROS (+)   Abdominal   Peds  Hematology negative hematology ROS (+)   Anesthesia Other Findings   Reproductive/Obstetrics (+) Pregnancy                             Anesthesia Physical Anesthesia Plan  ASA: II  Anesthesia Plan: Epidural   Post-op Pain Management:    Induction:   PONV Risk Score and Plan: Treatment may vary due to age or medical condition  Airway Management Planned: Natural Airway  Additional Equipment:   Intra-op Plan:   Post-operative Plan:   Informed Consent: I have reviewed the patients History and Physical, chart, labs and discussed the procedure including the risks, benefits and alternatives for the proposed anesthesia with the patient or authorized representative who has indicated his/her understanding and acceptance.       Plan Discussed with: Anesthesiologist  Anesthesia Plan Comments: (Patient identified. Risks, benefits, options discussed with patient including but not limited to bleeding, infection, nerve damage, paralysis, failed block, incomplete pain control, headache, blood pressure changes, nausea,  vomiting, reactions to medication, itching, and post partum back pain. Confirmed with bedside nurse the patient's most recent platelet count. Confirmed with the patient that they are not taking any anticoagulation, have any bleeding history or any family history of bleeding disorders. Patient expressed understanding and wishes to proceed. All questions were answered. )        Anesthesia Quick Evaluation

## 2020-05-17 NOTE — Anesthesia Procedure Notes (Signed)
Epidural Patient location during procedure: OB Start time: 05/17/2020 2:40 AM End time: 05/17/2020 2:50 AM  Staffing Anesthesiologist: Elmer Picker, MD Performed: anesthesiologist   Preanesthetic Checklist Completed: patient identified, IV checked, risks and benefits discussed, monitors and equipment checked, pre-op evaluation and timeout performed  Epidural Patient position: sitting Prep: DuraPrep and site prepped and draped Patient monitoring: continuous pulse ox, blood pressure, heart rate and cardiac monitor Approach: midline Location: L3-L4 Injection technique: LOR air  Needle:  Needle type: Tuohy  Needle gauge: 17 G Needle length: 9 cm Needle insertion depth: 4 cm Catheter type: closed end flexible Catheter size: 19 Gauge Catheter at skin depth: 10 cm Test dose: negative  Assessment Sensory level: T8 Events: blood not aspirated, injection not painful, no injection resistance, no paresthesia and negative IV test  Additional Notes Patient identified. Risks/Benefits/Options discussed with patient including but not limited to bleeding, infection, nerve damage, paralysis, failed block, incomplete pain control, headache, blood pressure changes, nausea, vomiting, reactions to medication both or allergic, itching and postpartum back pain. Confirmed with bedside nurse the patient's most recent platelet count. Confirmed with patient that they are not currently taking any anticoagulation, have any bleeding history or any family history of bleeding disorders. Patient expressed understanding and wished to proceed. All questions were answered. Sterile technique was used throughout the entire procedure. Please see nursing notes for vital signs. Test dose was given through epidural catheter and negative prior to continuing to dose epidural or start infusion. Warning signs of high block given to the patient including shortness of breath, tingling/numbness in hands, complete motor block,  or any concerning symptoms with instructions to call for help. Patient was given instructions on fall risk and not to get out of bed. All questions and concerns addressed with instructions to call with any issues or inadequate analgesia.  Reason for block:procedure for pain

## 2020-05-17 NOTE — Progress Notes (Addendum)
Labor Progress Note Diana Woodard is a 24 y.o. G1P0000 at [redacted]w[redacted]d presented for SOL S: Feeling exhausted   O:  BP 114/61    Pulse 75    Temp 98.5 F (36.9 C) (Oral)    Resp 18    LMP 08/09/2019    SpO2 97%  EFM: 120 HR/ moderate variability/ +accels / no decels  ToCo: 4 minutes   CVE: Dilation: 7.5 Effacement (%): 90 Cervical Position: Anterior Station: -1 Presentation: Vertex Exam by:: Dr. Myriam Jacobson   A&P: 24 y.o. G1P0000 [redacted]w[redacted]d admitted for for SOL   #Labor: admitted in spontaneous labor, had made steady change but now has been overall unchanged since AROM. IUPC placed, will start pitocin.   #Pain: Epidural  #FWB: Cat 1 #GBS negative     Sul Jonni Sanger, Medical Student 2:11 PM  I saw and evaluated the patient with the medical student. I agree with the findings and the plan of care as documented in the medical student note.  Casper Harrison, MD [facility] Family [facility] Fellow, [facility] for [facility], [facility]

## 2020-05-18 NOTE — Progress Notes (Signed)
CSW received consult for hx of Anxiety and Depression.  CSW met with MOB to offer support and complete assessment.    CSW congratulated MOB and FOB on the birth of infant. CSW advised MOB and FOB of the HIPPA policy in which MOB was agreeable to having FOB leave room. Once FOB was no longer in room, CSW advised MOB of CSW's role and the reason for CSW coming to speak with her. MOB expressed that she was diagnosed with anxiety and depression "about 5 years ago". MOB voiced that she has a therapist Diana Woodard) that she has been seeing once a week where "we discuss PPD and possibly medication". CSW asked MOB if she has current medication use to help with her anxiety and depression in which MOB expressed no current [place] of medication however MOB does expressed that she has a past use with desire for current use of medication. Per MOB, she and counsleor are working this outpatient. CSW did advise MOB that in the event that counselor is not able to assist with medication use, MOB Is able to follow up with [facility] provider for further needs of medication. MOB expressed that therapist has been working well for her and that she has no other mental health hx. CSW asked MOB about safety in which MOB expressed no SI, HI or DV to this CSW.  CSW inquired from [facility] on who her other supports are. MOB expressed that she has support from her "family, boyfriend and a great friend group". CSW was advised that MOB has all needed items to care for infant with plans for infant to be seen at [facility].   CSW provided education regarding the baby blues period vs. perinatal mood disorders, discussed treatment and gave resources for mental health follow up if concerns arise.  CSW recommends self-evaluation during the postpartum time period using the New Mom Checklist from Postpartum Progress and encouraged MOB to contact a medical professional if symptoms are noted at any time.   CSW provided review of Sudden Infant Death  Syndrome (SIDS) precautions.  In which MOB expressed that she has a basinet for infant to sleep in once arrived home.    CSW identifies no further need for intervention and no barriers to discharge at this time.   Diana Woodard, MSW, LCSW [facility] [phone]

## 2020-05-18 NOTE — Progress Notes (Addendum)
Post Partum Day 1 Subjective: no complaints, voiding, tolerating PO and + flatus  Objective: Blood pressure (!) 104/56, pulse 66, temperature (!) 97.5 F (36.4 C), temperature source Oral, resp. rate 16, last menstrual period 08/09/2019, SpO2 98 %, unknown if currently breastfeeding.  Physical Exam:  General: alert, cooperative, appears stated age and no distress Lochia: appropriate, patient states she had a small clot the size of a quarter last night, but no further clots since then. Reassured patient the clot size described is normal. Uterine Fundus: firm Lacerations: bilateral labial lacerations, healing well, pt has no complaints DVT Evaluation: No significant calf/ankle edema.  Recent Labs    05/17/20 0158  HGB 12.5  HCT 37.6    Assessment/Plan: Plan for discharge tomorrow   LOS: 1 day   Valli Glance 05/18/2020, 9:03 AM    I personally saw and evaluated the patient, performing the key elements of the service. I developed and verified the management plan that is described in the resident's/student's note, and I agree with the content with my edits above. VSS, HRR&R, Resp unlabored, Legs neg.  Nigel Berthold, CNM 05/21/2020 2:27 PM

## 2020-05-18 NOTE — Anesthesia Postprocedure Evaluation (Signed)
Anesthesia Post Note  Patient: Diana Woodard  Procedure(s) Performed: AN AD HOC LABOR EPIDURAL     Patient location during evaluation: Mother Baby Anesthesia Type: Epidural Level of consciousness: awake and alert, oriented and patient cooperative Pain management: pain level controlled Vital Signs Assessment: post-procedure vital signs reviewed and stable Respiratory status: spontaneous breathing Cardiovascular status: stable Postop Assessment: no headache, epidural receding, patient able to bend at knees and no signs of nausea or vomiting Anesthetic complications: no Comments: Pt. States she is walking. Pain score 0.    No complications documented.  Last Vitals:  Vitals:   05/18/20 0247 05/18/20 0551  BP: 117/64 (!) 104/56  Pulse: 84 66  Resp: 16 16  Temp: 36.6 C (!) 36.4 C  SpO2: 97% 98%    Last Pain:  Vitals:   05/18/20 0551  TempSrc: Oral  PainSc: 0-No pain   Pain Goal: Patients Stated Pain Goal: 6 (05/17/20 0801)                 Merrilyn Puma

## 2020-05-18 NOTE — Lactation Note (Signed)
This note was copied from a baby's chart. Lactation Consultation Note  Patient Name: Diana Woodard Today's Date: 05/18/2020 Reason for consult: Follow-up assessment;Primapara;1st time breastfeeding   P1 mother whose infant is now 74 hours old.  This is a term baby at 40+2 weeks.    Mother requested latch assistance.    When I arrived mother had baby latched to the left breast using a #20 NS.  Mother was interested in having me observe his latch.  He appeared to be latched correctly, however, I asked mother to remove him from the breast and latch again.  When she removed him from the breast the NS was dry.  Observed mother's nipples to be short shafted and everted.  Suggested we try to latch him without the NS and mother agreeable.  Assisted to latch in the football hold on the left breast.  Baby would suck a few times and pull back.  With repeated attempts he sustained the latch and began actively sucking.  After a few minutes, intermittent swallows were noted.  Mother denied pain with latching.  Demonstrated breast compressions and gentle stimulation to keep him sucking.  When he self released mother placed him STS on her chest.  She will continue to observe for feeding cues and feed 8-12 times/24 hours.  She will call her RN/LC for latch assistance as needed.  Discussed using the DEBP if mother continues to require the NS.  Mother had not been set up with the pump yet.  Mother verbalized understanding.    Mother has a DEBP for home use.  Father and support person present.   Maternal Data    Feeding Feeding Type: Breast Fed  LATCH Score Latch: Repeated attempts needed to sustain latch, nipple held in mouth throughout feeding, stimulation needed to elicit sucking reflex.  Audible Swallowing: A few with stimulation  Type of Nipple: Everted at rest and after stimulation (short shafted)  Comfort (Breast/Nipple): Soft / non-tender  Hold (Positioning): Assistance needed to  correctly position infant at breast and maintain latch.  LATCH Score: 7  Interventions Interventions: Breast feeding basics reviewed;Assisted with latch;Skin to skin;Breast massage;Hand express;Breast compression;Adjust position;Position options;Support pillows  Lactation Tools Discussed/Used Tools: Nipple Shields Nipple shield size: 20   Consult Status Consult Status: Follow-up Date: 05/19/20 Follow-up type: In-patient    Dora Sims 05/18/2020, 4:34 PM

## 2020-05-18 NOTE — Lactation Note (Addendum)
This note was copied from a baby's chart. Lactation Consultation Note  Patient Name: Diana Woodard Today's Date: 05/18/2020 Reason for consult: Initial assessment;Mother's request;Difficult latch;Primapara;1st time breastfeeding;Term  Infant is 40 weeks 3 hours old with birthweight <7 lbs. Mom stated they latched in L & D but only able to latched with a few sucks on and off the breast. Since on the floor they tried again at 11:15 pm but only for a few minutes.   Mom's breast are erect and noted to increase in size during pregnancy. LC did breast massage and hand expression but no drops of colostrum noted. LC then assisted latching baby in football for 6 minutes and then infant still showing cues. Infant re-latched for additional 6 minutes with signs of milk transfer. Infant had some dimpling of the cheeks and a clicking sound on the left side. Infant is able to extend the tongue pass the gum line.   LC set up a manual pump and reviewed assembly, parts and cleaning and milk storage.  LC reviewed I's and O's sheet with parents. Parents stated infant had 1 stool and no urine since birth.   Mom has no pump at home and does have WIC.   Plan 1. To feed infant based on cues 8-12x in 24 hour period no more than 3 hours without an attempt. Mom to place infant STS and look for signs of milk transfer.         2. Mom to use the manual pump for increase stimulation to pre pump or use hand expression to get drops of colostrum on breasts prior to latch.   LC also gave instructions to RN to encourage Mom to use manual pump to increase stimulation q 3 hours for 10 minutes.         3. LC reviewed outpatient and inpatient services with parents.          4. LC reviewed findings with RN to observe a latch and feedings see how he does.

## 2020-05-19 MED ORDER — IBUPROFEN 600 MG PO TABS
600.0000 mg | ORAL_TABLET | Freq: Four times a day (QID) | ORAL | 0 refills | Status: DC
Start: 2020-05-19 — End: 2020-06-14

## 2020-05-19 MED ORDER — ACETAMINOPHEN 325 MG PO TABS
650.0000 mg | ORAL_TABLET | ORAL | 0 refills | Status: DC | PRN
Start: 2020-05-19 — End: 2020-06-14

## 2020-05-19 NOTE — Lactation Note (Signed)
This note was copied from a baby's chart. Lactation Consultation Note  Patient Name: Boy Undra Ury Today's Date: 05/19/2020   Infant is 37 hrs old. Infant was on the L breast (finishing a feeding) and seemed content. I was unable to assign a LATCH score since I was viewing the end of the feeding & infant had become sleepy. Mom's nipple had slight creasing at the edge when infant released latch but that may have been b/c infant had held nipple in anterior portion of mouth as the feeding ended.   Parents have been supplementing with formula or DBM after feeding at the breast. Recently, parents transitioned to using a bottle for supplementing.  Infant has been latching well on the L breast, but not on the R breast. Latching was attempted on the R breast, but without success. Mom chose to go ahead and supplement the [facility] that they had warmed up.   When preparing to offer infant bottle, I noticed an anterior frenulum that restricted elevation and seemed to "pull the tongue" down lower into the oral cavity. I mentioned this to the parents. Dad mentioned that his 67 yr old child (from a previous marriage) had had the same issue.   Using the extra-slow flow nipple, infant was noted to do very well with the bottle and Dad was shown how to hold infant upright for feedings.   DBM labels wouldn't scan. The lot # for the milk given at 1001 was: [id number].   Dr. Sherryll Burger came into room to do exam; I shared my observation about possible tongue restriction with her.  This LC will return at a later time.     Lurline Hare [facility] 05/19/2020, 9:57 AM

## 2020-05-19 NOTE — Lactation Note (Signed)
This note was copied from a baby's chart. Lactation Consultation Note  Patient Name: Diana Woodard Today's Date: 05/19/2020   Parents & infant are packed up and preparing to leave. I reminded Mom that if infant is not latching to the R breast then to please pump that side. Mom responded that she is taking a manual pump home & an electric pump would be arriving tomorrow.   I also asked if she thought the nipple shield she had been given previously would help with latching onto the R breast. Mom said she could give it a try. I cautioned Mom that if the nipple shield does work, to please let us know so that we could arrange for her to have an outpatient appt to monitor milk transfer, milk production, etc. Mom verbalized understanding.   Lurline Hare [facility] 05/19/2020, 2:26 PM

## 2020-05-19 NOTE — Social Work (Signed)
CSW received consult for MDD, postpartum. An assessment was completed and MOB was provided with mental health resources yesterday. Per previous CSW note, MOB has a therapist she sees once a week. MOB reported they discuss PPD and possible medication. CSW met with MOB again to assess for any additional concerns. MOB reported she is currently feeling okay and has no additional needs at this time.   CSW identifies no further need for intervention and no barriers to discharge at this time.  Darra Lis, [place] [employer] [phone]

## 2020-05-19 NOTE — Lactation Note (Addendum)
This note was copied from a baby's chart. Lactation Consultation Note  Patient Name: Diana Woodard Today's Date: 05/19/2020 Reason for consult: Follow-up assessment;Mother's request;Difficult latch P1, 28  hour term female infant, -3% weight loss .   Per mom, infant mostly been BF in football hold , she is concern infant not getting any milk and she requested donor breast milk to supplement with breastfeeding. Infant is not sustaining latch using football hold position and mom been latching infant with out NS. LC suggested mom use the cross cradle hold position, infant latched with depth and sustained latch, breastfed for 15 minutes. LC discussed with mom colostrum and infant's small tummy size and it is enough for infant, mom expressed small amount of colostrum and seemed re-assured. Mom still wants to use donor breast milk , infant was given 7 mls of donor breast milk by curve tip syringe and understands once heat it must be used within 1 hour. Mom will continue to BF according to hunger cues, 8 to 12+ times within 24 hours. Mom knows to ask RN or LC for assistance if needed with latching infant at the breast. Mom will latch infant at breast first for every feeding before offering donor breast milk.  Maternal Data  Maternal Data    Feeding Feeding Type: Breast Fed  LATCH Score Latch: Grasps breast easily, tongue down, lips flanged, rhythmical sucking.  Audible Swallowing: Spontaneous and intermittent  Type of Nipple: Everted at rest and after stimulation  Comfort (Breast/Nipple): Soft / non-tender  Hold (Positioning): Assistance needed to correctly position infant at breast and maintain latch.  LATCH Score: 9  Interventions Interventions: Skin to skin;Assisted with latch;Breast massage;Hand express;Expressed milk;Position options;Support pillows;Adjust position;Breast compression  Lactation Tools Discussed/Used     Consult Status Consult Status: Follow-up Date:  05/19/20 Follow-up type: In-patient    Danelle Earthly 05/19/2020, 12:15 AM

## 2020-05-21 ENCOUNTER — Other Ambulatory Visit (HOSPITAL_COMMUNITY): Payer: BC Managed Care – PPO

## 2020-05-21 ENCOUNTER — Encounter: Payer: BC Managed Care – PPO | Admitting: Advanced Practice Midwife

## 2020-05-22 ENCOUNTER — Inpatient Hospital Stay (HOSPITAL_COMMUNITY)
Admission: AD | Admit: 2020-05-22 | Payer: BC Managed Care – PPO | Source: Home / Self Care | Admitting: Obstetrics and Gynecology

## 2020-05-22 ENCOUNTER — Inpatient Hospital Stay (HOSPITAL_COMMUNITY): Payer: BC Managed Care – PPO

## 2020-06-04 ENCOUNTER — Encounter: Payer: BC Managed Care – PPO | Admitting: Licensed Clinical Social Worker

## 2020-06-14 ENCOUNTER — Other Ambulatory Visit: Payer: Self-pay

## 2020-06-14 ENCOUNTER — Encounter: Payer: Self-pay | Admitting: Nurse Practitioner

## 2020-06-14 ENCOUNTER — Ambulatory Visit (INDEPENDENT_AMBULATORY_CARE_PROVIDER_SITE_OTHER): Payer: BC Managed Care – PPO | Admitting: Nurse Practitioner

## 2020-06-14 DIAGNOSIS — Z30017 Encounter for initial prescription of implantable subdermal contraceptive: Secondary | ICD-10-CM | POA: Diagnosis not present

## 2020-06-14 DIAGNOSIS — F32A Depression, unspecified: Secondary | ICD-10-CM

## 2020-06-14 DIAGNOSIS — Z3202 Encounter for pregnancy test, result negative: Secondary | ICD-10-CM

## 2020-06-14 DIAGNOSIS — Z8759 Personal history of other complications of pregnancy, childbirth and the puerperium: Secondary | ICD-10-CM

## 2020-06-14 LAB — POCT URINE PREGNANCY: Preg Test, Ur: NEGATIVE

## 2020-06-14 MED ORDER — ETONOGESTREL 68 MG ~~LOC~~ IMPL
68.0000 mg | DRUG_IMPLANT | Freq: Once | SUBCUTANEOUS | Status: AC
Start: 2020-06-14 — End: 2020-06-14
  Administered 2020-06-14: 68 mg via SUBCUTANEOUS

## 2020-06-14 NOTE — Progress Notes (Signed)
Post Partum Visit Note  Diana Woodard is a 24 y.o. G26P1001 female who presents for a postpartum visit. She is 4 weeks postpartum following a normal spontaneous vaginal delivery.  I have fully reviewed the prenatal and intrapartum course. The delivery was at [redacted]w[redacted]d gestational weeks.  Anesthesia: epidural. Postpartum course has been unremarkable. Baby is doing well. Baby is feeding by bottle - Enfamil Neuropro. Bleeding no bleeding. Bowel function is normal. Bladder function is normal. Patient is not sexually active. Contraception method is none. Pt wants Nexplanon. Postpartum depression screening: negative.   The pregnancy intention screening data noted above was reviewed. Potential methods of contraception were discussed. The patient elected to proceed with Hormonal Implant.      The following portions of the patient's history were reviewed and updated as appropriate: allergies, current medications, past family history, past medical history, past social history, past surgical history and problem list.  Review of Systems Pertinent items noted in HPI and remainder of comprehensive ROS otherwise negative.    Objective:  BP 137/81   Pulse (!) 102   LMP 08/09/2019    General:  alert, cooperative and no distress   Breasts:  not examined  Lungs: clear to auscultation bilaterally  Heart:  regular rate and rhythm, S1, S2 normal, no murmur, click, rub or gallop  Abdomen: not examined   Vulva: Pelvic deferred  Vagina:   Cervix:    Corpus:   Adnexa:    Rectal Exam:          Nexplanon Insertion Patient identified, informed consent performed, consent signed.   Appropriate time out taken. Nexplanon site identified.  Area prepped in usual sterile fashon. Three  ml of 1% lidocaine was used to anesthetize the area for insetion of the implant.  There was minimal blood loss. There were no complications.     A pressure bandage was applied to reduce any bruising although some bruising noted  immediately on insertion.  Client and provider able to palpate the inserted rod.  The patient tolerated the procedure well and was given post procedure instructions.  Advised no unprotected sex for 2 weeks and to call the office if she is having any problems with unexpected spotting.  Assessment:    Normal postpartum exam. Pap smear not done at today's visit.   Plan:   Essential components of care per ACOG recommendations:  1.  Mood and well being: Patient with negative depression screening today. Reviewed local resources for support.  - Patient does not use tobacco.  - hx of drug use? No    2. Infant care and feeding:  -Patient currently breastmilk feeding? No  -Social determinants of health (SDOH) reviewed in EPIC. No concerns  3. Sexuality, contraception and birth spacing - Patient does not want a pregnancy in the next year.   - Reviewed forms of contraception in tiered fashion. Patient desired Nexplanon today.   - Discussed birth spacing of 18 months  4. Sleep and fatigue -Encouraged family/partner/community support of 4 hrs of uninterrupted sleep to help with mood and fatigue  5. Physical Recovery  - Discussed patients delivery and complications - Patient had a labial degree laceration, perineal healing reviewed. Patient expressed understanding - Patient has urinary incontinence? No - Patient is safe to resume physical and sexual activity in 2 weeks  6.  Health Maintenance - Last pap smear done 12/14/2019 and was abnormal with positive with negative HPV.  Colpo appointment is later this month.  7. Chronic Disease Colposcopy scheduled  on 06-26-20 Covid vaccine encouraged but doubtful that she will have Covid vaccine  Dalphine Handing, CMA [facility] [employer], [facility]  Nolene Bernheim, RN, MSN, NP-BC Nurse Practitioner, [facility] for [employer], [facility] 06/14/2020 2:41 PM

## 2020-06-26 ENCOUNTER — Encounter: Payer: BC Managed Care – PPO | Admitting: Obstetrics and Gynecology
